# Patient Record
Sex: Female | Born: 1980 | Race: White | Hispanic: No | Marital: Single | Smoking: Current every day smoker
Health system: Southern US, Community
[De-identification: ages and names within clinical notes are randomized; demographics above are authoritative.]

## PROBLEM LIST (undated history)

## (undated) DIAGNOSIS — F1910 Other psychoactive substance abuse, uncomplicated: Secondary | ICD-10-CM

## (undated) DIAGNOSIS — F4310 Post-traumatic stress disorder, unspecified: Secondary | ICD-10-CM

## (undated) DIAGNOSIS — F909 Attention-deficit hyperactivity disorder, unspecified type: Secondary | ICD-10-CM

## (undated) DIAGNOSIS — F1120 Opioid dependence, uncomplicated: Secondary | ICD-10-CM

## (undated) DIAGNOSIS — F419 Anxiety disorder, unspecified: Secondary | ICD-10-CM

## (undated) DIAGNOSIS — F319 Bipolar disorder, unspecified: Secondary | ICD-10-CM

## (undated) DIAGNOSIS — F32A Depression, unspecified: Secondary | ICD-10-CM

## (undated) DIAGNOSIS — F329 Major depressive disorder, single episode, unspecified: Secondary | ICD-10-CM

## (undated) DIAGNOSIS — F819 Developmental disorder of scholastic skills, unspecified: Secondary | ICD-10-CM

## (undated) HISTORY — PX: TONSILLECTOMY: SUR1361

---

## 2008-05-19 ENCOUNTER — Emergency Department (HOSPITAL_BASED_OUTPATIENT_CLINIC_OR_DEPARTMENT_OTHER): Admission: EM | Admit: 2008-05-19 | Discharge: 2008-05-19 | Payer: Self-pay | Admitting: Emergency Medicine

## 2008-05-28 ENCOUNTER — Encounter: Payer: Self-pay | Admitting: Emergency Medicine

## 2008-05-28 ENCOUNTER — Inpatient Hospital Stay (HOSPITAL_COMMUNITY): Admission: AD | Admit: 2008-05-28 | Discharge: 2008-05-28 | Payer: Self-pay | Admitting: Obstetrics & Gynecology

## 2008-06-20 ENCOUNTER — Encounter: Payer: Self-pay | Admitting: Obstetrics and Gynecology

## 2008-06-20 ENCOUNTER — Ambulatory Visit: Payer: Self-pay | Admitting: Obstetrics & Gynecology

## 2008-06-20 LAB — CONVERTED CEMR LAB
Eosinophils Absolute: 0.1 10*3/uL (ref 0.0–0.7)
Hepatitis B Surface Ag: NEGATIVE
Lymphocytes Relative: 32 % (ref 12–46)
Lymphs Abs: 3.7 10*3/uL (ref 0.7–4.0)
Neutro Abs: 7 10*3/uL (ref 1.7–7.7)
Neutrophils Relative %: 61 % (ref 43–77)
Platelets: 238 10*3/uL (ref 150–400)
Rh Type: POSITIVE
Rubella: 97.8 intl units/mL — ABNORMAL HIGH
WBC: 11.5 10*3/uL — ABNORMAL HIGH (ref 4.0–10.5)

## 2008-07-11 ENCOUNTER — Ambulatory Visit: Payer: Self-pay | Admitting: Obstetrics & Gynecology

## 2008-07-13 ENCOUNTER — Inpatient Hospital Stay (HOSPITAL_COMMUNITY): Admission: RE | Admit: 2008-07-13 | Discharge: 2008-07-13 | Payer: Self-pay | Admitting: Family Medicine

## 2008-07-26 ENCOUNTER — Ambulatory Visit: Payer: Self-pay | Admitting: Family Medicine

## 2008-07-26 ENCOUNTER — Ambulatory Visit (HOSPITAL_COMMUNITY): Admission: RE | Admit: 2008-07-26 | Discharge: 2008-07-26 | Payer: Self-pay | Admitting: Obstetrics & Gynecology

## 2008-08-01 ENCOUNTER — Ambulatory Visit: Payer: Self-pay | Admitting: Obstetrics & Gynecology

## 2008-08-08 ENCOUNTER — Ambulatory Visit: Payer: Self-pay | Admitting: Obstetrics & Gynecology

## 2008-08-09 ENCOUNTER — Inpatient Hospital Stay (HOSPITAL_COMMUNITY): Admission: RE | Admit: 2008-08-09 | Discharge: 2008-08-09 | Payer: Self-pay | Admitting: Obstetrics & Gynecology

## 2008-08-09 ENCOUNTER — Ambulatory Visit: Payer: Self-pay | Admitting: Physician Assistant

## 2008-08-15 ENCOUNTER — Ambulatory Visit: Payer: Self-pay | Admitting: Obstetrics & Gynecology

## 2008-08-15 LAB — CONVERTED CEMR LAB
MCHC: 32.2 g/dL (ref 30.0–36.0)
MCV: 89.1 fL (ref 78.0–100.0)
RBC: 3.94 M/uL (ref 3.87–5.11)

## 2008-09-13 ENCOUNTER — Ambulatory Visit: Payer: Self-pay | Admitting: Family Medicine

## 2008-09-14 ENCOUNTER — Encounter: Payer: Self-pay | Admitting: Obstetrics & Gynecology

## 2008-11-01 ENCOUNTER — Ambulatory Visit: Payer: Self-pay | Admitting: Family Medicine

## 2008-11-01 ENCOUNTER — Encounter: Payer: Self-pay | Admitting: Obstetrics & Gynecology

## 2008-11-02 ENCOUNTER — Encounter: Payer: Self-pay | Admitting: Obstetrics & Gynecology

## 2008-11-08 ENCOUNTER — Ambulatory Visit: Payer: Self-pay | Admitting: Family Medicine

## 2008-11-08 ENCOUNTER — Inpatient Hospital Stay (HOSPITAL_COMMUNITY): Admission: RE | Admit: 2008-11-08 | Discharge: 2008-11-10 | Payer: Self-pay | Admitting: Family Medicine

## 2008-11-27 ENCOUNTER — Ambulatory Visit: Payer: Self-pay | Admitting: Obstetrics & Gynecology

## 2009-01-20 ENCOUNTER — Emergency Department (HOSPITAL_BASED_OUTPATIENT_CLINIC_OR_DEPARTMENT_OTHER): Admission: EM | Admit: 2009-01-20 | Discharge: 2009-01-20 | Payer: Self-pay | Admitting: Emergency Medicine

## 2009-02-21 ENCOUNTER — Emergency Department (HOSPITAL_BASED_OUTPATIENT_CLINIC_OR_DEPARTMENT_OTHER): Admission: EM | Admit: 2009-02-21 | Discharge: 2009-02-21 | Payer: Self-pay | Admitting: Emergency Medicine

## 2010-09-14 ENCOUNTER — Encounter: Payer: Self-pay | Admitting: *Deleted

## 2010-11-30 LAB — DIFFERENTIAL
Basophils Relative: 1 % (ref 0–1)
Eosinophils Absolute: 0.1 10*3/uL (ref 0.0–0.7)
Eosinophils Relative: 1 % (ref 0–5)
Lymphs Abs: 4 10*3/uL (ref 0.7–4.0)
Monocytes Absolute: 0.6 10*3/uL (ref 0.1–1.0)
Monocytes Relative: 7 % (ref 3–12)

## 2010-11-30 LAB — BASIC METABOLIC PANEL
BUN: 10 mg/dL (ref 6–23)
Chloride: 105 mEq/L (ref 96–112)
GFR calc Af Amer: >60 mL/min (ref >60)
Potassium: 3.9 mEq/L (ref 3.5–5.1)

## 2010-11-30 LAB — POCT TOXICOLOGY PANEL

## 2010-11-30 LAB — CBC
HCT: 40.1 % (ref 36.0–46.0)
MCV: 83.3 fL (ref 78.0–100.0)
RBC: 4.82 MIL/uL (ref 3.87–5.11)
WBC: 8.4 10*3/uL (ref 4.0–10.5)

## 2010-11-30 LAB — URINALYSIS, ROUTINE W REFLEX MICROSCOPIC
Glucose, UA: NEGATIVE mg/dL
Hgb urine dipstick: NEGATIVE
Ketones, ur: NEGATIVE mg/dL
Protein, ur: NEGATIVE mg/dL
pH: 6 (ref 5.0–8.0)

## 2010-11-30 LAB — ETHANOL: Alcohol, Ethyl (B): <5 mg/dL (ref 0–10)

## 2010-12-02 LAB — URINALYSIS, ROUTINE W REFLEX MICROSCOPIC
Bilirubin Urine: NEGATIVE
Glucose, UA: NEGATIVE mg/dL
Hgb urine dipstick: NEGATIVE
Protein, ur: NEGATIVE mg/dL
Specific Gravity, Urine: 1.009 (ref 1.005–1.030)
Urobilinogen, UA: 1 mg/dL (ref 0.0–1.0)

## 2010-12-02 LAB — DIFFERENTIAL
Basophils Relative: 0 % (ref 0–1)
Eosinophils Absolute: 0.1 10*3/uL (ref 0.0–0.7)
Eosinophils Relative: 1 % (ref 0–5)
Lymphs Abs: 2.9 10*3/uL (ref 0.7–4.0)
Monocytes Absolute: 0.5 10*3/uL (ref 0.1–1.0)
Monocytes Relative: 7 % (ref 3–12)

## 2010-12-02 LAB — BASIC METABOLIC PANEL
CO2: 26 mEq/L (ref 19–32)
Chloride: 109 mEq/L (ref 96–112)
Creatinine, Ser: 0.7 mg/dL (ref 0.4–1.2)
GFR calc Af Amer: >60 mL/min (ref >60)
Potassium: 4 mEq/L (ref 3.5–5.1)

## 2010-12-02 LAB — CBC
HCT: 42.9 % (ref 36.0–46.0)
MCHC: 32.2 g/dL (ref 30.0–36.0)
MCV: 82.2 fL (ref 78.0–100.0)
RBC: 5.22 MIL/uL — ABNORMAL HIGH (ref 3.87–5.11)
WBC: 8 10*3/uL (ref 4.0–10.5)

## 2010-12-02 LAB — POCT TOXICOLOGY PANEL: Opiates: POSITIVE

## 2010-12-02 LAB — ETHANOL: Alcohol, Ethyl (B): <5 mg/dL (ref 0–10)

## 2010-12-04 LAB — RPR: RPR Ser Ql: NONREACTIVE

## 2010-12-04 LAB — CBC
HCT: 30.6 % — ABNORMAL LOW (ref 36.0–46.0)
MCV: 86 fL (ref 78.0–100.0)
MCV: 86.3 fL (ref 78.0–100.0)
Platelets: 164 10*3/uL (ref 150–400)
Platelets: 189 10*3/uL (ref 150–400)
RBC: 4.1 MIL/uL (ref 3.87–5.11)
RDW: 14.6 % (ref 11.5–15.5)
WBC: 15.9 10*3/uL — ABNORMAL HIGH (ref 4.0–10.5)

## 2010-12-04 LAB — CROSSMATCH: ABO/RH(D): AB POS

## 2010-12-04 LAB — ABO/RH: ABO/RH(D): AB POS

## 2010-12-08 LAB — POCT URINALYSIS DIP (DEVICE)
Glucose, UA: 100 mg/dL — AB
Ketones, ur: NEGATIVE mg/dL
Protein, ur: 100 mg/dL — AB
Urobilinogen, UA: 0.2 mg/dL (ref 0.0–1.0)

## 2011-01-06 NOTE — Op Note (Signed)
NAME:  Lynn Marshall, Lynn Marshall          ACCOUNT NO.:  [id number]   MEDICAL RECORD NO.:  [id number]           PATIENT TYPE:   LOCATION:                                 FACILITY:   PHYSICIAN:  Tanya S. Shawnie Pons, M.D.   DATE OF BIRTH:  08/05/1981   DATE OF PROCEDURE:  11/08/2008  DATE OF DISCHARGE:                               OPERATIVE REPORT   TIME OF PROCEDURE:  10:10.   PREOPERATIVE DIAGNOSES:  1. Intrauterine pregnancy at 39-0/7 weeks' gestation.  2. History of previous cesarean section x2.  3. Undesired fertility.  4. History of substances abuse.   POSTOPERATIVE DIAGNOSES:  1. Intrauterine pregnancy at 39-0/7 weeks' gestation.  2. History of previous cesarean section x2.  3. Undesired fertility.  4. History of substances abuse.  5. Nuchal cord.   PROCEDURES:  1. A repeat low-transverse cesarean section.  2. Bilateral tubal sterilization with Filshie clips.   SURGEON:  Shelbie Proctor. Shawnie Pons, MD   ASSISTANT:  Odie Sera, DO   ANESTHESIA:  Spinal with local.   INDICATION FOR PROCEDURE:  Lynn Marshall is a 30 year old  gravida 6, now para 3-0-3-3 at 39-0/7 weeks for repeat low-transverse  cesarean section due to a history of previous cesarean section x2.  She  also desires postpartum sterilization.  She has previously been  counseled the risks and benefits of these procedures to include, but not  limited to bleeding, infection, and damage to internal organs, which may  require additional surgery and possibly a hysterectomy.  She has also  been counseled on the failure rate of tubal sterilization of 1 in 200  with an increased rate of ectopic pregnancy should of failure occur.  The patient voiced understanding of this risks and desires to proceed  with both procedures.   DESCRIPTION OF PROCEDURE:  The patient was taken to the operating room,  where spinal anesthesia was introduced.  She was then prepped and draped  in the usual sterile manner and a time-out was  conducted.  Appropriate  anesthesia was confirmed.  Pfannenstiel incision was made in the skin  and extended through the subcutaneous layers to the fascia.  The fascia  was then incised in the midline and the fascial incision was extended  laterally with Mayo scissors.  The fascia was bluntly dissected off the  underlying rectus muscles.  The rectus muscles were entered bluntly with  a hemostat.  The opening was then extended superiorly and inferiorly,  first with Mayo scissors and then with manual traction.  The peritoneum  was also entered bluntly and extended with manual traction.  Appropriate  entry to the uterus was obtained.  The Alexis retractor was placed.  A  transverse incision was made in the lower uterine segment and extended  through the myometrial layers, two bulging membranes were noted.  The  uterine incision was extended laterally using manual traction.  The  membranes were ruptured using an Allis clamp.  Clear amniotic fluid was  noted.  The fetal head was grasped and elevated out of pelvis and  delivered through the uterine incision with the assistance of fundal  pressure.  The mouth and nares were bulb suctioned.  A tight nuchal cord  was noted and reduced manually.  The shoulders followed by the rest of  corpus were delivered in the usual manner.  The mouth and nares were  again bulb suctioned and the cord was clamped and cut x2, and the baby  was handed to the awaiting NICU staff.  The placenta was then delivered  with the assistance of fundal massage.  The placenta was intact and had  three-vessel cord.  The uterus was cleared of clots and debris with a  dry lap sponge.  The uterine incision was closed using 0 Vicryl in a  running interlocking fashion.  A one-layer closure was done.  Good  hemostasis was noted in the uterine incision.  Attention was then  brought to the fallopian tubes, the left fallopian tube was identified  and traced distally to identification of  the fimbriated end.  A Filshie  clip was placed approximately 2 cm from the cornum of the uterus without  difficulty.  The tube was then returned to the abdomen and the right  fallopian tube was identified and traced distally to identification of  fimbria.  A Filshie clip was then placed approximately 2-cm from cornum  of the uterus without difficulty.  The tube was then returned to the  abdomen.  Good hemostasis was again noted of the uterine incision.  The  peritoneum was closed using a 0 Vicryl in a running non-interlocking  fashion.  The fascia was then closed using 0 Vicryl in a running non-  interlocking fashion.  Good hemostasis was noted.  The were no defects  noted on the fascia.  Some small oozing of the subcutaneous tissues were  treated with electrocautery.  The skin was then closed with staples in  the usual manner.  Marcaine 10 mL each of 2.25% were injected both  superiorly and inferiorly to the uterine incision and the 5 mL were  injected at each anterior and superior iliac spine.  A pressure dressing  was applied.  All sponge, instrument, and needle counts were correct x2.   FINDINGS:  1. Clear amniotic fluid.  2. Viable female infant with Apgars of 9 and 9 and weight 7 pounds 9      ounces.  3. Grossly normal fallopian tubes and ovaries bilaterally.   SPECIMEN:  Placenta.   DISPOSITION:  To Labor and Delivery.  There is approximately 700 mL of  blood loss.  There were no immediate complications.  The patient was  taken to the PACU in good condition.      Odie Sera, DO  Electronically Signed     ______________________________  Shelbie Proctor. Shawnie Pons, M.D.    MC/MEDQ  D:  11/08/2008  T:  11/08/2008  Job:  [id number]

## 2011-01-06 NOTE — Discharge Summary (Signed)
NAME:  Lynn Marshall, Lynn Marshall          ACCOUNT NO.:  [id number]   MEDICAL RECORD NO.:  [id number]          PATIENT TYPE:  INP   LOCATION:  9143                          FACILITY:  [facility]   PHYSICIAN:  Tanya S. Shawnie Pons, M.D.   DATE OF BIRTH:  [birth year]   DATE OF ADMISSION:  11/08/2008  DATE OF DISCHARGE:  11/10/2008                               DISCHARGE SUMMARY   PREOPERATIVE DIAGNOSES:  1. Intrauterine pregnancy at 39 weeks and 0/7th days.  2. History of previous cesarean section x2.  3. Undesired fertility.  4. History of substance abuse.   POSTOPERATIVE DIAGNOSES:  1. Intrauterine pregnancy at 78 and 0/7th weeks gestation.  2. History of previous cesarean section x2.  3. Undesired fertility.  4. History of substance abuse.  5. Nuchal cords.   PROCEDURE:  1. Low-transverse cesarean section.  2. Bilateral tubal sterilization with Filshie clips.   HOSPITAL ADMISSION:  The patient is a 30 year old G6, P2-0-3-2, at 71  and 0/7 weeks, who was admitted for a repeat low transverse cesarean  section due to history of two prior cesarean sections and desire for  postpartum sterilization.  She had been follow in our [facility]  and had routine prenatal care.   PAST MEDICAL HISTORY:  Positive for substance abuse incarceration while  she is being followed in our [facility].  On 18th, a repeat low  transverse cesarean section was performed by Dr. Shawnie Pons and Dr. Noel Gerold,  and produced a viable female with Apgars of 9 and 9, weighing 7 pounds  and 9 ounces.  Please see the dictated operative note for details of the  surgery.   HOSPITAL COURSE:  The patient's postpartum and postoperative courses  have been uneventful.  Her vital signs have been within normal limits  and her exam is also within normal limits.  The patient is being  discharged on postoperative day #2 in stable status.  At her admission,  hemoglobin is 11.7, and hematocrit is 35.4.  At discharge, hemoglobin is  10.3 and  hematocrit 30.6.   DISCHARGE DIAGNOSES:  1. Intrauterine pregnancy at [redacted] weeks gestation.  2. History of previous cesarean section x2.  3. Undesired fertility.  4. History of substance abuse.   STATUS:  At the time of discharge is stable.   DISCHARGE PRESCRIPTION:  1. Percocet 5/325 with instructions of 1-2 tablets p.o. q.4-6 h.      p.r.n. pain.  2. Motrin 600 mg one q.6 h. p.r.n. cramping.   DISCHARGE INSTRUCTIONS:  The patient is instructed on incisional care as  well as bottle feeding and engorgement precautions.  She is given  instructions for pelvic rest x6 weeks, and no driving when on narcotic  pain medicines.  Signs and symptoms of patient were reviewed with  patient and when to call our office in the postpartum period for  abnormals.  The patient was instructed to follow up in 6 weeks at the  [facility] at [facility] for her postpartum visit.      Maylon Cos, C.N.M.      Shelbie Proctor. Shawnie Pons, M.D.  Electronically Signed  SS/MEDQ  D:  11/10/2008  T:  11/10/2008  Job:  [id number]

## 2011-05-25 LAB — CBC
Platelets: 281
WBC: 11.3 — ABNORMAL HIGH

## 2011-05-25 LAB — BASIC METABOLIC PANEL
BUN: 5 — ABNORMAL LOW
Creatinine, Ser: 0.4
GFR calc Af Amer: >60
GFR calc non Af Amer: >60

## 2011-05-25 LAB — DIFFERENTIAL
Lymphocytes Relative: 34
Lymphs Abs: 3.8
Neutro Abs: 6.7
Neutrophils Relative %: 59

## 2011-05-25 LAB — POCT TOXICOLOGY PANEL

## 2011-05-25 LAB — ETHANOL: Alcohol, Ethyl (B): <5

## 2011-05-25 LAB — HCG, QUANTITATIVE, PREGNANCY: hCG, Beta Chain, Quant, S: 27925 — ABNORMAL HIGH

## 2011-05-25 LAB — PREGNANCY, URINE: Preg Test, Ur: POSITIVE

## 2011-05-26 LAB — POCT URINALYSIS DIP (DEVICE)
Hgb urine dipstick: NEGATIVE
Hgb urine dipstick: NEGATIVE
Nitrite: NEGATIVE
Nitrite: NEGATIVE
Protein, ur: NEGATIVE
Urobilinogen, UA: 0.2
pH: 6
pH: 6.5

## 2011-05-26 LAB — WET PREP, GENITAL
Clue Cells Wet Prep HPF POC: NONE SEEN
Trich, Wet Prep: NONE SEEN
Yeast Wet Prep HPF POC: NONE SEEN

## 2011-05-26 LAB — URINE MICROSCOPIC-ADD ON

## 2011-05-26 LAB — CBC
HCT: 39.1
Hemoglobin: 12.9
MCHC: 32.8
MCV: 85.3
RBC: 4.59

## 2011-05-26 LAB — DIFFERENTIAL
Basophils Relative: 1
Eosinophils Absolute: 0
Eosinophils Relative: 0
Monocytes Absolute: 0.6
Monocytes Relative: 5

## 2011-05-26 LAB — GC/CHLAMYDIA PROBE AMP, GENITAL: Chlamydia, DNA Probe: NEGATIVE

## 2011-05-26 LAB — URINE CULTURE: Colony Count: NO GROWTH

## 2011-05-26 LAB — URINALYSIS, ROUTINE W REFLEX MICROSCOPIC
Glucose, UA: NEGATIVE
Ketones, ur: NEGATIVE
Leukocytes, UA: NEGATIVE
pH: 7

## 2011-05-26 LAB — STREP B DNA PROBE

## 2011-05-29 LAB — POCT URINALYSIS DIP (DEVICE)
Glucose, UA: 250 mg/dL — AB
Glucose, UA: NEGATIVE mg/dL
Nitrite: NEGATIVE
Nitrite: NEGATIVE
Protein, ur: 30 mg/dL — AB
Protein, ur: 30 mg/dL — AB
Specific Gravity, Urine: 1.015 (ref 1.005–1.030)
Specific Gravity, Urine: 1.02 (ref 1.005–1.030)
Urobilinogen, UA: 0.2 mg/dL (ref 0.0–1.0)
Urobilinogen, UA: 0.2 mg/dL (ref 0.0–1.0)

## 2011-05-29 LAB — GLUCOSE, CAPILLARY: Glucose-Capillary: 79 mg/dL (ref 70–99)

## 2012-01-11 ENCOUNTER — Emergency Department (HOSPITAL_COMMUNITY)
Admission: EM | Admit: 2012-01-11 | Discharge: 2012-01-11 | Disposition: A | Discharge status: Home / Self Care | Payer: Self-pay | Attending: Emergency Medicine | Admitting: Emergency Medicine

## 2012-01-11 ENCOUNTER — Encounter (HOSPITAL_COMMUNITY): Payer: Self-pay | Admitting: *Deleted

## 2012-01-11 DIAGNOSIS — F1110 Opioid abuse, uncomplicated: Secondary | ICD-10-CM

## 2012-01-11 DIAGNOSIS — F17200 Nicotine dependence, unspecified, uncomplicated: Secondary | ICD-10-CM | POA: Insufficient documentation

## 2012-01-11 DIAGNOSIS — L0291 Cutaneous abscess, unspecified: Secondary | ICD-10-CM

## 2012-01-11 DIAGNOSIS — F1210 Cannabis abuse, uncomplicated: Secondary | ICD-10-CM | POA: Insufficient documentation

## 2012-01-11 DIAGNOSIS — IMO0002 Reserved for concepts with insufficient information to code with codable children: Secondary | ICD-10-CM | POA: Insufficient documentation

## 2012-01-11 LAB — COMPREHENSIVE METABOLIC PANEL
ALT: 11 U/L (ref 0–35)
AST: 17 U/L (ref 0–37)
Alkaline Phosphatase: 78 U/L (ref 39–117)
CO2: 29 mEq/L (ref 19–32)
Calcium: 9 mg/dL (ref 8.4–10.5)
Potassium: 4.2 mEq/L (ref 3.5–5.1)
Sodium: 137 mEq/L (ref 135–145)
Total Protein: 7.4 g/dL (ref 6.0–8.3)

## 2012-01-11 LAB — URINALYSIS, ROUTINE W REFLEX MICROSCOPIC
Bilirubin Urine: NEGATIVE
Glucose, UA: NEGATIVE mg/dL
Hgb urine dipstick: NEGATIVE
Specific Gravity, Urine: 1.003 — ABNORMAL LOW (ref 1.005–1.030)
pH: 8 (ref 5.0–8.0)

## 2012-01-11 LAB — CBC
MCV: 85.9 fL (ref 78.0–100.0)
Platelets: 344 10*3/uL (ref 150–400)
RDW: 14.7 % (ref 11.5–15.5)
WBC: 12.8 10*3/uL — ABNORMAL HIGH (ref 4.0–10.5)

## 2012-01-11 LAB — URINE MICROSCOPIC-ADD ON

## 2012-01-11 LAB — DIFFERENTIAL
Basophils Absolute: 0 10*3/uL (ref 0.0–0.1)
Eosinophils Absolute: 0.2 10*3/uL (ref 0.0–0.7)
Eosinophils Relative: 2 % (ref 0–5)
Lymphocytes Relative: 30 % (ref 12–46)
Neutrophils Relative %: 59 % (ref 43–77)

## 2012-01-11 LAB — RAPID URINE DRUG SCREEN, HOSP PERFORMED
Amphetamines: NOT DETECTED
Barbiturates: NOT DETECTED
Benzodiazepines: NOT DETECTED
Cocaine: NOT DETECTED
Tetrahydrocannabinol: NOT DETECTED

## 2012-01-11 MED ORDER — CLINDAMYCIN HCL 300 MG PO CAPS
300.0000 mg | ORAL_CAPSULE | Freq: Once | ORAL | Status: AC
  Administered 2012-01-11: 300 mg via ORAL
  Filled 2012-01-11: qty 1

## 2012-01-11 MED ORDER — CLINDAMYCIN HCL 150 MG PO CAPS: 450.0000 mg | ORAL_CAPSULE | Freq: Three times a day (TID) | ORAL | Status: AC

## 2012-01-11 NOTE — Discharge Instructions (Signed)
Abscess An abscess (boil or furuncle) is an infected area that contains a collection of pus.  SYMPTOMS Signs and symptoms of an abscess include pain, tenderness, redness, or hardness. You may feel a moveable soft area under your skin. An abscess can occur anywhere in the body.  TREATMENT  A surgical cut (incision) may be made over your abscess to drain the pus. Gauze may be packed into the space or a drain may be looped through the abscess cavity (pocket). This provides a drain that will allow the cavity to heal from the inside outwards. The abscess may be painful for a few days, but should feel much better if it was drained.  Your abscess, if seen early, may not have localized and may not have been drained. If not, another appointment may be required if it does not get better on its own or with medications. HOME CARE INSTRUCTIONS   Only take over-the-counter or prescription medicines for pain, discomfort, or fever as directed by your caregiver.   Take your antibiotics as directed if they were prescribed. Finish them even if you start to feel better.   Keep the skin and clothes clean around your abscess.   If the abscess was drained, you will need to use gauze dressing to collect any draining pus. Dressings will typically need to be changed 3 or more times a day.   The infection may spread by skin contact with others. Avoid skin contact as much as possible.   Practice good hygiene. This includes regular hand washing, cover any draining skin lesions, and do not share personal care items.   If you participate in sports, do not share athletic equipment, towels, whirlpools, or personal care items. Shower after every practice or tournament.   If a draining area cannot be adequately covered:   Do not participate in sports.   Children should not participate in day care until the wound has healed or drainage stops.   If your caregiver has given you a follow-up appointment, it is very important  to keep that appointment. Not keeping the appointment could result in a much worse infection, chronic or permanent injury, pain, and disability. If there is any problem keeping the appointment, you must call back to this facility for assistance.  SEEK MEDICAL CARE IF:   You develop increased pain, swelling, redness, drainage, or bleeding in the wound site.   You develop signs of generalized infection including muscle aches, chills, fever, or a general ill feeling.   You have an oral temperature above 102 F (38.9 C).  MAKE SURE YOU:   Understand these instructions.   Will watch your condition.   Will get help right away if you are not doing well or get worse.  Document Released: 05/20/2005 Document Revised: 07/30/2011 Document Reviewed: 03/13/2008 ExitCare Patient Information 2012 [employer] RESOURCE GUIDE  Dental Problems  Patients with Medicaid: [facility]                     [address]                                           Phone:  [phone]                                                    If unable to pay or uninsured, contact:  [facility] or [facility] to become qualified for the adult dental clinic.  Chronic Pain Problems Contact [facility]  [phone] Patients need to be referred by their primary care doctor.  Insufficient Money for Medicine Contact [employer]:  call "211" or [facility] 271-5999.  No Primary Care Doctor Call [facility]  832-8000 Other agencies that provide inexpensive medical care    [facility]  832-8035    [facility]  [phone]    [facility]  [phone]    [facility]  [phone]    [facility]  [phone]    [facility]  [phone]  Substance Abuse Resources [facility]  336-882-2125 [facility] 336-784-9470 [facility] 336-285-9073 [facility]  336-845-3988 Residential & Outpatient Substance Abuse Program  800-659-3381  Psychological Services [facility]  832-9600 [facility]  [phone] [facility]   800 853-5163 (emergency services 641-4993)  Abuse/Neglect [place] Child Abuse Hotline (336) 641-3795 [place] Child Abuse Hotline 800-378-5315 (After Hours)  Emergency Shelter [facility] (336) 271-5985  [facility] (336) 275-9566 [facility] (704) 372-4663  MRSA Hotline #:   832-7006    [place] Resources  [facility] [place]  [employer]                           [facility] [address] [place]                     [address]         [address]  [place]                                               [place]                              [place] Phone:  [phone]                                  Phone:  [phone]                   Phone:  [phone]  [facility] Phone:  [phone]  [place] Child Abuse Hotline (336) 342-1394 (336) 342-3537 (After Hours) 

## 2012-01-11 NOTE — ED Notes (Signed)
Pharmacy notified for 2000 medication

## 2012-01-11 NOTE — ED Notes (Signed)
MD at bedside. 

## 2012-01-11 NOTE — ED Notes (Signed)
To ED for eval of abscess to right forearm. Pt also requesting detox from heroin. Last use this am

## 2012-01-11 NOTE — ED Provider Notes (Signed)
History     CSN: [id number]  Arrival date & time 01/11/12  1509   First MD Initiated Contact with Patient 01/11/12 1853      Chief Complaint  Patient presents with  . Abscess    here for arm abscess; also requesting detox  . detox     (Consider location/radiation/quality/duration/timing/severity/associated sxs/prior treatment) HPI Pt with history of IV heroin abuse has had 3 days of pain and swelling to her R forearm. No drainage, no fever. Pain is severe, aching and radiating up to her axilla.   Pt also wants to be evaluated for heroin detox. She last used this morning. Has been through detox in the past. She has smoked marijuana recently, but denies any other coingestions. She denies any SI/HI.  History reviewed. No pertinent past medical history.  Past Surgical History  Procedure Date  . Cesarean section     History reviewed. No pertinent family history.  History  Substance Use Topics  . Smoking status: Current Everyday Smoker -- 1.0 packs/day    Types: Cigarettes  . Smokeless tobacco: Not on file  . Alcohol Use: No    OB History    Grav Para Term Preterm Abortions TAB SAB Ect Mult Living                  Review of Systems All other systems reviewed and are negative except as noted in HPI.   Allergies  Review of patient's allergies indicates no known allergies.  Home Medications   Current Outpatient Rx  Name Route Sig Dispense Refill  . IBUPROFEN 200 MG PO TABS Oral Take 600 mg by mouth every 6 (six) hours as needed. For pain      BP 111/78  Pulse 78  Temp(Src) 98.7 F (37.1 C) (Oral)  Resp 14  SpO2 99%  Physical Exam  Nursing note and vitals reviewed. Constitutional: She is oriented to person, place, and time. She appears well-developed and well-nourished.  HENT:  Head: Normocephalic and atraumatic.  Eyes: EOM are normal. Pupils are equal, round, and reactive to light.  Neck: Normal range of motion. Neck supple.  Cardiovascular: Normal rate,  normal heart sounds and intact distal pulses.   Pulmonary/Chest: Effort normal and breath sounds normal.  Abdominal: Bowel sounds are normal. She exhibits no distension. There is no tenderness.  Musculoskeletal: Normal range of motion. She exhibits edema and tenderness.       3cm x 5cm area of induration and erythema to R forearm with small area of central fluctuance.   Neurological: She is alert and oriented to person, place, and time. She has normal strength. No cranial nerve deficit or sensory deficit.  Skin: Skin is warm and dry. No rash noted.  Psychiatric: She has a normal mood and affect.    ED Course  Procedures (including critical care time)  Labs Reviewed  CBC - Abnormal; Notable for the following:    WBC 12.8 (*)    All other components within normal limits  DIFFERENTIAL - Abnormal; Notable for the following:    Monocytes Absolute 1.1 (*)    All other components within normal limits  COMPREHENSIVE METABOLIC PANEL  URINALYSIS, ROUTINE W REFLEX MICROSCOPIC  URINE RAPID DRUG SCREEN (HOSP PERFORMED)  ETHANOL   No results found.   No diagnosis found.    MDM  INCISION AND DRAINAGE Performed by: Pollyann Savoy. Consent: Verbal consent obtained. Risks and benefits: risks, benefits and alternatives were discussed Time out performed prior to procedure Type: abscess  Body area: R forearm  Anesthesia: local infiltration  Local anesthetic: lidocaine 1%  Anesthetic total: 1 ml  Complexity: complex Blunt dissection to break up loculations  Drainage: purulent  Drainage amount: 5mL  Packing material: 1/4" iodoform gauze  Patient tolerance: Patient tolerated the procedure well with no immediate complications.  9:15 PM Pt now states she does not want to stay for detox placement. Given a list of outpatient resources and advised to call for bed availability in the morning. Rx for clindamycin given as well.        Chrissie Dacquisto B. Bernette Mayers, MD 01/11/12 2116

## 2012-01-11 NOTE — ED Notes (Signed)
Substance Abuse Resources given to patient.

## 2012-04-17 ENCOUNTER — Emergency Department (HOSPITAL_COMMUNITY)
Admission: EM | Admit: 2012-04-17 | Discharge: 2012-04-18 | Disposition: A | Discharge status: Home / Self Care | Payer: Self-pay | Attending: Emergency Medicine | Admitting: Emergency Medicine

## 2012-04-17 ENCOUNTER — Encounter (HOSPITAL_COMMUNITY): Payer: Self-pay | Admitting: Emergency Medicine

## 2012-04-17 DIAGNOSIS — F17200 Nicotine dependence, unspecified, uncomplicated: Secondary | ICD-10-CM | POA: Insufficient documentation

## 2012-04-17 DIAGNOSIS — F1910 Other psychoactive substance abuse, uncomplicated: Secondary | ICD-10-CM | POA: Insufficient documentation

## 2012-04-17 LAB — CBC
HCT: 39 % (ref 36.0–46.0)
Hemoglobin: 12.8 g/dL (ref 12.0–15.0)
MCH: 26.9 pg (ref 26.0–34.0)
MCV: 82.1 fL (ref 78.0–100.0)
RBC: 4.75 MIL/uL (ref 3.87–5.11)

## 2012-04-17 LAB — COMPREHENSIVE METABOLIC PANEL
ALT: 11 U/L (ref 0–35)
CO2: 29 mEq/L (ref 19–32)
Calcium: 9.5 mg/dL (ref 8.4–10.5)
GFR calc Af Amer: >90 mL/min (ref >90)
GFR calc non Af Amer: >90 mL/min (ref >90)
Glucose, Bld: 109 mg/dL — ABNORMAL HIGH (ref 70–99)
Sodium: 139 mEq/L (ref 135–145)

## 2012-04-17 LAB — RAPID URINE DRUG SCREEN, HOSP PERFORMED
Barbiturates: NOT DETECTED
Cocaine: POSITIVE — AB

## 2012-04-17 LAB — POCT PREGNANCY, URINE: Preg Test, Ur: NEGATIVE

## 2012-04-17 MED ORDER — LORAZEPAM 1 MG PO TABS: 1.0000 mg | ORAL_TABLET | Freq: Three times a day (TID) | ORAL | Status: DC | PRN

## 2012-04-17 MED ORDER — ONDANSETRON HCL 8 MG PO TABS: 4.0000 mg | ORAL_TABLET | Freq: Three times a day (TID) | ORAL | Status: DC | PRN

## 2012-04-17 MED ORDER — NICOTINE 21 MG/24HR TD PT24: 21.0000 mg | MEDICATED_PATCH | Freq: Every day | TRANSDERMAL | Status: DC

## 2012-04-17 MED ORDER — CLONIDINE HCL 0.1 MG PO TABS
0.1000 mg | ORAL_TABLET | Freq: Once | ORAL | Status: AC
  Administered 2012-04-17: 0.1 mg via ORAL
  Filled 2012-04-17: qty 1

## 2012-04-17 MED ORDER — ACETAMINOPHEN 325 MG PO TABS: 650.0000 mg | ORAL_TABLET | ORAL | Status: DC | PRN

## 2012-04-17 MED ORDER — IBUPROFEN 400 MG PO TABS: 600.0000 mg | ORAL_TABLET | Freq: Three times a day (TID) | ORAL | Status: DC | PRN

## 2012-04-17 MED ORDER — ZOLPIDEM TARTRATE 5 MG PO TABS: 5.0000 mg | ORAL_TABLET | Freq: Every evening | ORAL | Status: DC | PRN

## 2012-04-17 NOTE — ED Notes (Signed)
Pt requests detox from heroin.  Last heroin 4 hours ago. Denies suicidal and homicidal ideation.

## 2012-04-17 NOTE — ED Notes (Signed)
Irregular periods since March

## 2012-04-17 NOTE — ED Provider Notes (Signed)
History     CSN: [id number]  Arrival date & time 04/17/12  2115   First MD Initiated Contact with Patient 04/17/12 2220      Chief Complaint  Patient presents with  . Medical Clearance    (Consider location/radiation/quality/duration/timing/severity/associated sxs/prior treatment) HPI Comments: Patient is addicted to heroin she snorts this on a daily basis she's using 10-12 packs per day she's been a heroin addict for number of years she was last detoxed 6-8 months ago at [facility] at this point she has not had any withdrawal symptoms  The history is provided by the patient.    History reviewed. No pertinent past medical history.  Past Surgical History  Procedure Date  . Cesarean section     No family history on file.  History  Substance Use Topics  . Smoking status: Current Everyday Smoker -- 1.0 packs/day    Types: Cigarettes  . Smokeless tobacco: Not on file  . Alcohol Use: Yes    OB History    Grav Para Term Preterm Abortions TAB SAB Ect Mult Living                  Review of Systems  Gastrointestinal: Negative for nausea, vomiting and abdominal pain.  Musculoskeletal: Negative for myalgias.  Neurological: Negative for dizziness and weakness.    Allergies  Review of patient's allergies indicates no known allergies.  Home Medications  No current outpatient prescriptions on file.  BP 90/57  Pulse 65  Temp 98.3 F (36.8 C) (Oral)  Resp 16  SpO2 100%  Physical Exam  Constitutional: She appears well-developed and well-nourished.  HENT:  Head: Normocephalic.  Eyes: Pupils are equal, round, and reactive to light.  Neck: Normal range of motion.  Cardiovascular: Normal rate.   Musculoskeletal: Normal range of motion.  Neurological: She is alert.  Skin: Skin is warm.    ED Course  Procedures (including critical care time)  Labs Reviewed  CBC - Abnormal; Notable for the following:    WBC 13.3 (*)     RDW 17.7 (*)     All other  components within normal limits  COMPREHENSIVE METABOLIC PANEL - Abnormal; Notable for the following:    Potassium 3.3 (*)     Glucose, Bld 109 (*)     All other components within normal limits  URINE RAPID DRUG SCREEN (HOSP PERFORMED) - Abnormal; Notable for the following:    Opiates POSITIVE (*)     Cocaine POSITIVE (*)     Benzodiazepines POSITIVE (*)     Tetrahydrocannabinol POSITIVE (*)     All other components within normal limits  ETHANOL  POCT PREGNANCY, URINE   No results found.   1. Substance abuse       MDM   Patient requesting detox from heroine will consul tACT Patient has been accepted awaiting transportation        Arman Filter, NP 04/17/12 2234  Arman Filter, NP 04/18/12 4098

## 2012-04-18 LAB — URINALYSIS, MICROSCOPIC ONLY
Glucose, UA: NEGATIVE mg/dL
Ketones, ur: NEGATIVE mg/dL
Nitrite: NEGATIVE
Protein, ur: 30 mg/dL — AB
pH: 6 (ref 5.0–8.0)

## 2012-04-18 MED ORDER — POTASSIUM CHLORIDE CRYS ER 20 MEQ PO TBCR
20.0000 meq | EXTENDED_RELEASE_TABLET | Freq: Once | ORAL | Status: AC
  Administered 2012-04-18: 20 meq via ORAL
  Filled 2012-04-18: qty 1
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
## 2012-04-18 NOTE — ED Provider Notes (Signed)
Medical screening examination/treatment/procedure(s) were performed by non-physician practitioner and as supervising physician I was immediately available for consultation/collaboration.   Glynn Octave, MD 04/18/12 [phone]

## 2012-05-16 ENCOUNTER — Emergency Department (HOSPITAL_COMMUNITY)
Admission: EM | Admit: 2012-05-16 | Discharge: 2012-05-16 | Disposition: A | Discharge status: Home / Self Care | Payer: Self-pay | Attending: Emergency Medicine | Admitting: Emergency Medicine

## 2012-05-16 ENCOUNTER — Encounter (HOSPITAL_COMMUNITY): Payer: Self-pay

## 2012-05-16 DIAGNOSIS — F329 Major depressive disorder, single episode, unspecified: Secondary | ICD-10-CM | POA: Insufficient documentation

## 2012-05-16 DIAGNOSIS — F3289 Other specified depressive episodes: Secondary | ICD-10-CM | POA: Insufficient documentation

## 2012-05-16 DIAGNOSIS — N390 Urinary tract infection, site not specified: Secondary | ICD-10-CM | POA: Insufficient documentation

## 2012-05-16 DIAGNOSIS — N764 Abscess of vulva: Secondary | ICD-10-CM | POA: Insufficient documentation

## 2012-05-16 DIAGNOSIS — F17200 Nicotine dependence, unspecified, uncomplicated: Secondary | ICD-10-CM | POA: Insufficient documentation

## 2012-05-16 DIAGNOSIS — L0291 Cutaneous abscess, unspecified: Secondary | ICD-10-CM

## 2012-05-16 HISTORY — DX: Depression, unspecified: F32.A

## 2012-05-16 HISTORY — DX: Major depressive disorder, single episode, unspecified: F32.9

## 2012-05-16 LAB — URINE MICROSCOPIC-ADD ON

## 2012-05-16 LAB — URINALYSIS, ROUTINE W REFLEX MICROSCOPIC
Nitrite: POSITIVE — AB
Specific Gravity, Urine: 1.009 (ref 1.005–1.030)
Urobilinogen, UA: 0.2 mg/dL (ref 0.0–1.0)
pH: 6.5 (ref 5.0–8.0)

## 2012-05-16 MED ORDER — HYDROMORPHONE HCL PF 1 MG/ML IJ SOLN
1.0000 mg | Freq: Once | INTRAMUSCULAR | Status: AC
  Administered 2012-05-16: 1 mg via INTRAMUSCULAR
  Filled 2012-05-16: qty 1

## 2012-05-16 MED ORDER — SULFAMETHOXAZOLE-TRIMETHOPRIM 800-160 MG PO TABS: 1.0000 | ORAL_TABLET | Freq: Two times a day (BID) | ORAL | Status: DC

## 2012-05-16 MED ORDER — OXYCODONE-ACETAMINOPHEN 5-325 MG PO TABS: 1.0000 | ORAL_TABLET | Freq: Four times a day (QID) | ORAL | Status: DC | PRN

## 2012-05-16 MED ORDER — CEPHALEXIN 500 MG PO CAPS: 500.0000 mg | ORAL_CAPSULE | Freq: Four times a day (QID) | ORAL | Status: DC

## 2012-05-16 MED ORDER — OXYCODONE-ACETAMINOPHEN 5-325 MG PO TABS
2.0000 | ORAL_TABLET | Freq: Once | ORAL | Status: AC
  Administered 2012-05-16: 2 via ORAL
  Filled 2012-05-16: qty 2

## 2012-05-16 NOTE — ED Provider Notes (Signed)
History     CSN: [id number]  Arrival date & time 05/16/12  1202   First MD Initiated Contact with Patient 05/16/12 1307      Chief Complaint  Patient presents with  . Abscess    (Consider location/radiation/quality/duration/timing/severity/associated sxs/prior treatment) HPI Comments: Lynn Marshall 31 y.o. female   The chief complaint is: Patient presents with:   Abscess   The patient has medical history significant for:   Past Medical History:   Depression                                                  Patient has a history of multiple abcesses, 7 in her lifetime. Today she presents with an abscess of her inner thigh and one on her labia majora. She states that the pain from these area are 8/10, with some flucturance. She saw a physician 2 months ago for the inner thigh abscess, was given ABX without I &D. She states there was some resolution but area continued to drain. The area on the labia has been there for two days. Denies fever or chills. Denies NVD or abdominal pain. Denies risk for STI's. Denies dysuria, urgency, or frequency.      The history is provided by the patient. No language interpreter was used.    Past Medical History  Diagnosis Date  . Depression     Past Surgical History  Procedure Date  . Cesarean section   . Tonsillectomy     No family history on file.  History  Substance Use Topics  . Smoking status: Current Every Day Smoker -- 1.0 packs/day    Types: Cigarettes  . Smokeless tobacco: Not on file  . Alcohol Use: Yes    OB History    Grav Para Term Preterm Abortions TAB SAB Ect Mult Living                  Review of Systems  Constitutional: Negative for fever and chills.  Gastrointestinal: Negative for nausea, vomiting, abdominal pain and diarrhea.  Genitourinary: Positive for vaginal pain. Negative for dysuria, urgency and frequency.  Skin: Positive for color change.  All other systems reviewed and are  negative.    Allergies  Review of patient's allergies indicates no known allergies.  Home Medications   Current Outpatient Rx  Name Route Sig Dispense Refill  . FLUOXETINE HCL 20 MG PO CAPS Oral Take 20 mg by mouth daily.      BP 114/77  Pulse 69  Temp 97.6 F (36.4 C) (Oral)  Resp 16  SpO2 98%  Physical Exam  Nursing note and vitals reviewed. Constitutional: She appears well-developed and well-nourished. She appears distressed.  HENT:  Head: Normocephalic and atraumatic.  Eyes: Conjunctivae normal and EOM are normal. No scleral icterus.  Neck: Normal range of motion. Neck supple.  Cardiovascular: Normal rate and normal heart sounds.   Pulmonary/Chest: Effort normal and breath sounds normal.  Abdominal: Soft. Bowel sounds are normal. There is no tenderness.  Genitourinary: Vagina normal.    There is tenderness and lesion on the right labia.  Neurological: She is alert.  Skin: Skin is warm and dry.    ED Course  Procedures (including critical care time) INCISION AND DRAINAGE Performed by: Pixie Casino Consent: Verbal consent obtained. Risks and benefits: risks, benefits and alternatives were discussed Type: abscess  Body area: right groin and right labia majora  Anesthesia: local infiltration  Local anesthetic: lidocaine 1%   Anesthetic total:3 ml  Complexity: complex Blunt dissection to break up loculations  Drainage: purulent  Drainage amount: 4ml  Packing material: 1/4 in iodoform gauze  Patient tolerance: Patient tolerated the procedure well with no immediate complications.    Labs Reviewed  URINALYSIS, ROUTINE W REFLEX MICROSCOPIC - Abnormal; Notable for the following:    APPearance CLOUDY (*)     Hgb urine dipstick TRACE (*)     Nitrite POSITIVE (*)     Leukocytes, UA MODERATE (*)     All other components within normal limits  URINE MICROSCOPIC-ADD ON - Abnormal; Notable for the following:    Squamous Epithelial / LPF FEW (*)      Bacteria, UA MANY (*)     All other components within normal limits   No results found.   1. Abscess   2. UTI (lower urinary tract infection)       MDM  Patient presented with abscess X 2 on her labia major and right groin. I &D performed successfully.UA: remarkable for UTI. Patient discharged on bactrim for UTI and keflex with return precautions. No red flags for bartholin's gland abscess or STI.         Pixie Casino, PA-C 05/16/12 1941

## 2012-05-16 NOTE — ED Notes (Signed)
C/o 2 abscess, one on her right inner thigh x 2 month, and one on her genital area (labia majora) x 2 days. Draining and tender touch, recently shaved her genital hair.

## 2012-05-18 LAB — URINE CULTURE

## 2012-05-18 NOTE — ED Provider Notes (Signed)
Medical screening examination/treatment/procedure(s) were performed by non-physician practitioner and as supervising physician I was immediately available for consultation/collaboration.   Suzi Roots, MD 05/18/12 [phone]

## 2012-05-20 NOTE — ED Notes (Signed)
+   Urine Patient treated Bactrim and macrobid-sts

## 2012-09-07 ENCOUNTER — Emergency Department (HOSPITAL_COMMUNITY)
Admission: EM | Admit: 2012-09-07 | Discharge: 2012-09-07 | Disposition: A | Discharge status: Home / Self Care | Payer: Self-pay | Attending: Emergency Medicine | Admitting: Emergency Medicine

## 2012-09-07 ENCOUNTER — Encounter (HOSPITAL_COMMUNITY): Payer: Self-pay | Admitting: Emergency Medicine

## 2012-09-07 DIAGNOSIS — N764 Abscess of vulva: Secondary | ICD-10-CM | POA: Insufficient documentation

## 2012-09-07 DIAGNOSIS — F3289 Other specified depressive episodes: Secondary | ICD-10-CM | POA: Insufficient documentation

## 2012-09-07 DIAGNOSIS — Z79899 Other long term (current) drug therapy: Secondary | ICD-10-CM | POA: Insufficient documentation

## 2012-09-07 DIAGNOSIS — L0291 Cutaneous abscess, unspecified: Secondary | ICD-10-CM

## 2012-09-07 DIAGNOSIS — F329 Major depressive disorder, single episode, unspecified: Secondary | ICD-10-CM | POA: Insufficient documentation

## 2012-09-07 DIAGNOSIS — F17200 Nicotine dependence, unspecified, uncomplicated: Secondary | ICD-10-CM | POA: Insufficient documentation

## 2012-09-07 MED ORDER — SULFAMETHOXAZOLE-TRIMETHOPRIM 800-160 MG PO TABS: 1.0000 | ORAL_TABLET | Freq: Two times a day (BID) | ORAL | Status: DC

## 2012-09-07 MED ORDER — LIDOCAINE HCL 2 % IJ SOLN
INTRAMUSCULAR | Status: AC
  Administered 2012-09-07: 400 mg
  Filled 2012-09-07: qty 20

## 2012-09-07 MED ORDER — HYDROCODONE-ACETAMINOPHEN 5-325 MG PO TABS
2.0000 | ORAL_TABLET | Freq: Once | ORAL | Status: AC
  Administered 2012-09-07: 2 via ORAL
  Filled 2012-09-07: qty 2

## 2012-09-07 NOTE — ED Notes (Signed)
Pt escorted to discharge window. Verbalized understanding discharge instructions. In no acute distress.   

## 2012-09-07 NOTE — ED Notes (Signed)
PA at bedside.

## 2012-09-07 NOTE — ED Notes (Signed)
Pt presenting to ed with c/o abscess to labia x 2 days with drainage.

## 2012-09-07 NOTE — ED Provider Notes (Signed)
History     CSN: [id number]  Arrival date & time 09/07/12  1134   First MD Initiated Contact with Patient 09/07/12 1150      Chief Complaint  Patient presents with  . Abscess    (Consider location/radiation/quality/duration/timing/severity/associated sxs/prior treatment) HPI Comments: This is a 32 year old female, who presents emergency department with chief complaint of abscess. The abscess is located on her right labia.  The patient states that she has had the abscess for the past 2 days.  She denies having fever, nausea, or vomiting.  The patient has a history of abscesses.  The patient shaves frequently, which is likely the cause of her symptoms.  She has tried epson salt baths, and warm compresses with no relief.  There are no aggravating or alleviating factors.  The patient states that her pain is moderate-severe.  The history is provided by the patient. No language interpreter was used.    Past Medical History  Diagnosis Date  . Depression     Past Surgical History  Procedure Date  . Cesarean section   . Tonsillectomy     No family history on file.  History  Substance Use Topics  . Smoking status: Current Every Day Smoker -- 1.0 packs/day    Types: Cigarettes  . Smokeless tobacco: Not on file  . Alcohol Use: Yes     Comment: occassionally    OB History    Grav Para Term Preterm Abortions TAB SAB Ect Mult Living                  Review of Systems  All other systems reviewed and are negative.    Allergies  Review of patient's allergies indicates no known allergies.  Home Medications   Current Outpatient Rx  Name  Route  Sig  Dispense  Refill  . FLUOXETINE HCL 20 MG PO CAPS   Oral   Take 20 mg by mouth every evening.          . SULFAMETHOXAZOLE-TRIMETHOPRIM 800-160 MG PO TABS   Oral   Take 1 tablet by mouth every 12 (twelve) hours.   20 tablet   0     BP 129/85  Pulse 105  Temp 98.1 F (36.7 C) (Oral)  Resp 18  SpO2 99%  LMP  08/22/2012  Physical Exam  Nursing note and vitals reviewed. Constitutional: She is oriented to person, place, and time. She appears well-developed and well-nourished.  HENT:  Head: Normocephalic and atraumatic.  Eyes: Conjunctivae normal and EOM are normal.  Neck: Normal range of motion.  Cardiovascular: Normal rate.   Pulmonary/Chest: Effort normal.  Abdominal: She exhibits no distension.  Musculoskeletal: Normal range of motion.  Neurological: She is alert and oriented to person, place, and time.  Skin: Skin is dry.       2x2 cm abscess to right labia, with mild surrounding erythema, no drainage.  Psychiatric: She has a normal mood and affect. Her behavior is normal. Judgment and thought content normal.    ED Course  Procedures (including critical care time)  Labs Reviewed - No data to display No results found.  INCISION AND DRAINAGE Performed by: Roxy Horseman Consent: Verbal consent obtained. Risks and benefits: risks, benefits and alternatives were discussed Type: abscess  Body area: right labia  Anesthesia: local infiltration  Incision was made with a scalpel.  Local anesthetic: lidocaine 2% without epinephrine  Anesthetic total: 4 ml  Complexity: complex Blunt dissection to break up loculations  Drainage: purulent  Drainage  amount: 2 ml  Packing material: no packing used  Patient tolerance: Patient tolerated the procedure well with no immediate complications.     1. Abscess       MDM  32 year old female with labial abscess. Incised and drained in the ED. Patient given Vicodin for pain in the ED. Will discharge the patient with Bactrim, and encourage the patient to use warm compresses. Encouraged patient to stop shaving. Patient understands and agrees with the plan. Return precautions have been given. Patient is stable and ready for discharge.        Roxy Horseman, PA-C 09/07/12 1504

## 2012-09-07 NOTE — ED Notes (Signed)
Pt c/o recurring abscess on  R labia.  Sts "it got better, after it was previously drained, but it returned two days ago."  Sts swelling is "higher" on peritoneal area than it was last time.  Sts she has felt fatigued and "not like myself" for two days.

## 2012-09-07 NOTE — ED Provider Notes (Signed)
Medical screening examination/treatment/procedure(s) were performed by non-physician practitioner and as supervising physician I was immediately available for consultation/collaboration.  Pallavi Clifton, MD 09/07/12 1541 

## 2012-09-08 NOTE — Progress Notes (Signed)
During 09/07/12 [facility] ED visit noted pt listed with no insurance coverage CM and [facility] liaison spoke with pt.  Pt offered services to assist with finding a [place] self pay provider & health reform information 

## 2013-09-10 ENCOUNTER — Encounter (HOSPITAL_COMMUNITY): Payer: Self-pay | Admitting: Emergency Medicine

## 2013-09-10 ENCOUNTER — Emergency Department (HOSPITAL_COMMUNITY)
Admission: EM | Admit: 2013-09-10 | Discharge: 2013-09-10 | Disposition: A | Discharge status: Home / Self Care | Payer: Self-pay | Attending: Emergency Medicine | Admitting: Emergency Medicine

## 2013-09-10 DIAGNOSIS — F329 Major depressive disorder, single episode, unspecified: Secondary | ICD-10-CM | POA: Insufficient documentation

## 2013-09-10 DIAGNOSIS — Z79899 Other long term (current) drug therapy: Secondary | ICD-10-CM | POA: Insufficient documentation

## 2013-09-10 DIAGNOSIS — R209 Unspecified disturbances of skin sensation: Secondary | ICD-10-CM | POA: Insufficient documentation

## 2013-09-10 DIAGNOSIS — L02412 Cutaneous abscess of left axilla: Secondary | ICD-10-CM

## 2013-09-10 DIAGNOSIS — F17200 Nicotine dependence, unspecified, uncomplicated: Secondary | ICD-10-CM | POA: Insufficient documentation

## 2013-09-10 DIAGNOSIS — F3289 Other specified depressive episodes: Secondary | ICD-10-CM | POA: Insufficient documentation

## 2013-09-10 DIAGNOSIS — IMO0002 Reserved for concepts with insufficient information to code with codable children: Secondary | ICD-10-CM | POA: Insufficient documentation

## 2013-09-10 MED ORDER — IBUPROFEN 400 MG PO TABS
800.0000 mg | ORAL_TABLET | Freq: Once | ORAL | Status: AC
  Administered 2013-09-10: 800 mg via ORAL
  Filled 2013-09-10: qty 2

## 2013-09-10 MED ORDER — HYDROCODONE-ACETAMINOPHEN 5-325 MG PO TABS: ORAL_TABLET | ORAL | Status: DC

## 2013-09-10 MED ORDER — NAPROXEN 500 MG PO TABS: 500.0000 mg | ORAL_TABLET | Freq: Two times a day (BID) | ORAL | Status: DC

## 2013-09-10 MED ORDER — SULFAMETHOXAZOLE-TRIMETHOPRIM 800-160 MG PO TABS: 1.0000 | ORAL_TABLET | Freq: Two times a day (BID) | ORAL | Status: DC

## 2013-09-10 NOTE — ED Provider Notes (Signed)
CSN: [id number]     Arrival date & time 09/10/13  1512 History  This chart was scribed for non-physician practitioner Junius Finner, PA-C working with Laray Anger, DO by Donne Anon, ED Scribe. This patient was seen in room TR07C/TR07C and the patient's care was started at 1559.    First MD Initiated Contact with Patient 09/10/13 1559     Chief Complaint  Patient presents with  . Abscess    The history is provided by the patient. No language interpreter was used.   HPI Comments: Lynn Marshall is a 33 y.o. female who presents to the Emergency Department complaining of 2 months of gradual onset, intermittent, recurring abscesses under her left armpit. She states the severity of the swelling wax and wane, but never completely leave. She has tried to drain the abscesses with little success. She has tried washing the area with antiseptic wash with mild relief.  She denies fever, nausea, vomiting or any other pain. She has not seen a medical provider for this problem. She states she has a history of abscesses along her bikini line and has had them professionally drained before. She denies allergies to any medication.  She does not currently have a PCP.  Past Medical History  Diagnosis Date  . Depression    Past Surgical History  Procedure Laterality Date  . Cesarean section    . Tonsillectomy     No family history on file. History  Substance Use Topics  . Smoking status: Current Every Day Smoker -- 1.00 packs/day    Types: Cigarettes  . Smokeless tobacco: Not on file  . Alcohol Use: Yes     Comment: occassionally   OB History   Grav Para Term Preterm Abortions TAB SAB Ect Mult Living                 Review of Systems  Constitutional: Negative for fever.  Gastrointestinal: Negative for nausea and vomiting.  Skin: Positive for color change and wound.  All other systems reviewed and are negative.    Allergies  Review of patient's allergies indicates no known  allergies.  Home Medications   Current Outpatient Rx  Name  Route  Sig  Dispense  Refill  . DULoxetine (CYMBALTA) 30 MG capsule   Oral   Take 30 mg by mouth daily.         Marland Kitchen HYDROcodone-acetaminophen (NORCO/VICODIN) 5-325 MG per tablet      Take 1-2 pills every 4-6 hours as needed for pain.   6 tablet   0   . naproxen (NAPROSYN) 500 MG tablet   Oral   Take 1 tablet (500 mg total) by mouth 2 (two) times daily.   30 tablet   0   . sulfamethoxazole-trimethoprim (SEPTRA DS) 800-160 MG per tablet   Oral   Take 1 tablet by mouth every 12 (twelve) hours.   20 tablet   0    BP 104/67  Pulse 79  Temp(Src) 97.4 F (36.3 C) (Oral)  Resp 18  Ht 5\' 6"  (1.676 m)  Wt 174 lb (78.926 kg)  BMI 28.10 kg/m2  SpO2 100%  LMP 09/03/2013  Physical Exam  Nursing note and vitals reviewed. Constitutional: She is oriented to person, place, and time. She appears well-developed and well-nourished.  HENT:  Head: Normocephalic and atraumatic.  Eyes: EOM are normal.  Neck: Normal range of motion.  Cardiovascular: Normal rate.   Pulmonary/Chest: Effort normal.  Musculoskeletal: Normal range of motion.  Neurological: She is  alert and oriented to person, place, and time.  Skin: Skin is warm and dry.  3 cm area of erythema and induration with tenderness in the left axilla. No warmth or red streaking. No active drainage.  Psychiatric: She has a normal mood and affect. Her behavior is normal.    ED Course  Procedures (including critical care time) DIAGNOSTIC STUDIES: Oxygen Saturation is 99% on RA, normal by my interpretation.    COORDINATION OF CARE: 5:00 PM Discussed treatment plan which includes I&D and 800 mg ibuprofen in ED with pt at bedside and pt agreed to plan. Abscesses discussed. Resource guide given. General surgery referral given. Advised pt to follow up in 2 days if problem persists. Will discharge home with 800-160 mg Septra tablets, 5-325 mg norco tablets, and 500 mg naproxen  tablet. Advised pt to use warm compresses for several days and to clean area with soap and water.   INCISION AND DRAINAGE Performed by: Junius FinnerErin O'Malley, PA-C Consent: Verbal consent obtained. Risks and benefits: risks, benefits and alternatives were discussed Type: abscess  Body area: left axilla  Anesthesia: local infiltration  Incision was made with a scalpel.  Local anesthetic: lidocaine 2% with epinephrine  Anesthetic total: 1 ml  Complexity: complex Blunt dissection to break up loculations  Drainage: purulent  Drainage amount: scant, bloody  Packing material: none  Patient tolerance: Patient tolerated the procedure well with no immediate complications.      Labs Review Labs Reviewed - No data to display Imaging Review No results found.  EKG Interpretation   None       MDM   1. Abscess of axilla, left    Abscess of left axilla, I&D see above. No immediate complications. No red streaking. Vitals: WNL. Pt /co persistent abscess in same area, will also prescribe Rx: bactrim. F/u in 2 days with PCP or return to ED for recheck. Return precautions provided. Pt verbalized understanding and agreement with tx plan.   I personally performed the services described in this documentation, which was scribed in my presence. The recorded information has been reviewed and is accurate.    Junius FinnerErin O'Malley, PA-C 09/10/13 2353

## 2013-09-10 NOTE — ED Notes (Signed)
Pt states shes been dealing with reoccurring boils under left armpit since December. Pt states its come up and down with pus, never really went away. Pt states shes attempted to drain it without much success. Small reddened area noted to left armpit.

## 2013-09-10 NOTE — Discharge Instructions (Signed)
Abscess An abscess (boil or furuncle) is an infected area on or under the skin. This area is filled with yellowish-white fluid (pus) and other material (debris). HOME CARE  Abscess Care After An abscess (also called a boil or furuncle) is an infected area that contains a collection of pus. Signs and symptoms of an abscess include pain, tenderness, redness, or hardness, or you may feel a moveable soft area under your skin. An abscess can occur anywhere in the body. The infection may spread to surrounding tissues causing cellulitis. A cut (incision) by the surgeon was made over your abscess and the pus was drained out. Gauze may have been packed into the space to provide a drain that will allow the cavity to heal from the inside outwards. The boil may be painful for 5 to 7 days. Most people with a boil do not have high fevers. Your abscess, if seen early, may not have localized, and may not have been lanced. If not, another appointment may be required for this if it does not get better on its own or with medications. HOME CARE INSTRUCTIONS   Only take over-the-counter or prescription medicines for pain, discomfort, or fever as directed by your caregiver.  When you bathe, soak and then remove gauze or iodoform packs at least daily or as directed by your caregiver. You may then wash the wound gently with mild soapy water. Repack with gauze or do as your caregiver directs. SEEK IMMEDIATE MEDICAL CARE IF:   You develop increased pain, swelling, redness, drainage, or bleeding in the wound site.  You develop signs of generalized infection including muscle aches, chills, fever, or a general ill feeling.  An oral temperature above 102 F (38.9 C) develops, not controlled by medication. See your caregiver for a recheck if you develop any of the symptoms described above. If medications (antibiotics) were prescribed, take them as directed. Document Released: 02/26/2005 Document Revised: 11/02/2011 Document  Reviewed: 10/24/2007 [facility] Patient Information 2014 [place]ExitCare, [place]LL[place].   Only take medicines as told by your doctor.  If you were given antibiotic medicine, take it as directed. Finish the medicine even if you start to feel better.  If gauze is used, follow your doctor's directions for changing the gauze.  To avoid spreading the infection:  Keep your abscess covered with a bandage.  Wash your hands well.  Do not share personal care items, towels, or whirlpools with others.  Avoid skin contact with others.  Keep your skin and clothes clean around the abscess.  Keep all doctor visits as told. GET HELP RIGHT AWAY IF:   You have more pain, puffiness (swelling), or redness in the wound site.  You have more fluid or blood coming from the wound site.  You have muscle aches, chills, or you feel sick.  You have a fever. MAKE SURE YOU:   Understand these instructions.  Will watch your condition.  Will get help right away if you are not doing well or get worse. Document Released: 01/27/2008 Document Revised: 02/09/2012 Document Reviewed: 10/23/2011 [facility] Patient Information 2014 [place] LakeExitCare, [place]LLC.

## 2013-09-11 NOTE — ED Provider Notes (Signed)
Medical screening examination/treatment/procedure(s) were performed by non-physician practitioner and as supervising physician I was immediately available for consultation/collaboration.  EKG Interpretation   None         Alyn Riedinger M Capri Raben, DO 09/11/13 1005 

## 2013-12-08 ENCOUNTER — Encounter (HOSPITAL_COMMUNITY): Payer: Self-pay | Admitting: Emergency Medicine

## 2013-12-08 ENCOUNTER — Emergency Department (HOSPITAL_COMMUNITY)
Admission: EM | Admit: 2013-12-08 | Discharge: 2013-12-08 | Disposition: A | Discharge status: Home / Self Care | Payer: No Typology Code available for payment source | Attending: Emergency Medicine | Admitting: Emergency Medicine

## 2013-12-08 ENCOUNTER — Emergency Department (HOSPITAL_COMMUNITY): Payer: No Typology Code available for payment source

## 2013-12-08 DIAGNOSIS — F329 Major depressive disorder, single episode, unspecified: Secondary | ICD-10-CM | POA: Insufficient documentation

## 2013-12-08 DIAGNOSIS — F17200 Nicotine dependence, unspecified, uncomplicated: Secondary | ICD-10-CM | POA: Insufficient documentation

## 2013-12-08 DIAGNOSIS — S7002XA Contusion of left hip, initial encounter: Secondary | ICD-10-CM

## 2013-12-08 DIAGNOSIS — Y92410 Unspecified street and highway as the place of occurrence of the external cause: Secondary | ICD-10-CM | POA: Insufficient documentation

## 2013-12-08 DIAGNOSIS — S39012A Strain of muscle, fascia and tendon of lower back, initial encounter: Secondary | ICD-10-CM

## 2013-12-08 DIAGNOSIS — R0789 Other chest pain: Secondary | ICD-10-CM

## 2013-12-08 DIAGNOSIS — Y9389 Activity, other specified: Secondary | ICD-10-CM | POA: Insufficient documentation

## 2013-12-08 DIAGNOSIS — S335XXA Sprain of ligaments of lumbar spine, initial encounter: Secondary | ICD-10-CM | POA: Insufficient documentation

## 2013-12-08 DIAGNOSIS — S298XXA Other specified injuries of thorax, initial encounter: Secondary | ICD-10-CM | POA: Insufficient documentation

## 2013-12-08 DIAGNOSIS — S7000XA Contusion of unspecified hip, initial encounter: Secondary | ICD-10-CM | POA: Insufficient documentation

## 2013-12-08 DIAGNOSIS — Z79899 Other long term (current) drug therapy: Secondary | ICD-10-CM | POA: Insufficient documentation

## 2013-12-08 DIAGNOSIS — F3289 Other specified depressive episodes: Secondary | ICD-10-CM | POA: Insufficient documentation

## 2013-12-08 MED ORDER — NAPROXEN 500 MG PO TABS
500.0000 mg | ORAL_TABLET | Freq: Once | ORAL | Status: AC
  Administered 2013-12-08: 500 mg via ORAL
  Filled 2013-12-08: qty 1

## 2013-12-08 MED ORDER — NAPROXEN 500 MG PO TABS: 500.0000 mg | ORAL_TABLET | Freq: Two times a day (BID) | ORAL | Status: DC

## 2013-12-08 MED ORDER — HYDROCODONE-ACETAMINOPHEN 5-325 MG PO TABS
2.0000 | ORAL_TABLET | Freq: Once | ORAL | Status: AC
  Administered 2013-12-08: 2 via ORAL
  Filled 2013-12-08: qty 2

## 2013-12-08 MED ORDER — METHOCARBAMOL 500 MG PO TABS
500.0000 mg | ORAL_TABLET | Freq: Once | ORAL | Status: AC
  Administered 2013-12-08: 500 mg via ORAL
  Filled 2013-12-08: qty 1

## 2013-12-08 MED ORDER — METHOCARBAMOL 500 MG PO TABS: 500.0000 mg | ORAL_TABLET | Freq: Two times a day (BID) | ORAL | Status: DC
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
## 2013-12-08 NOTE — Discharge Instructions (Signed)
Back Pain, Adult Low back pain is very common. About 1 in 5 people have back pain.The cause of low back pain is rarely dangerous. The pain often gets better over time.About half of people with a sudden onset of back pain feel better in just 2 weeks. About 8 in 10 people feel better by 6 weeks.  CAUSES Some common causes of back pain include:  Strain of the muscles or ligaments supporting the spine.  Wear and tear (degeneration) of the spinal discs.  Arthritis.  Direct injury to the back. DIAGNOSIS Most of the time, the direct cause of low back pain is not known.However, back pain can be treated effectively even when the exact cause of the pain is unknown.Answering your caregiver's questions about your overall health and symptoms is one of the most accurate ways to make sure the cause of your pain is not dangerous. If your caregiver needs more information, he or she may order lab work or imaging tests (X-rays or MRIs).However, even if imaging tests show changes in your back, this usually does not require surgery. HOME CARE INSTRUCTIONS For many people, back pain returns.Since low back pain is rarely dangerous, it is often a condition that people can learn to [facility] their own.   Remain active. It is stressful on the back to sit or stand in one place. Do not sit, drive, or stand in one place for more than 30 minutes at a time. Take short walks on level surfaces as soon as pain allows.Try to increase the length of time you walk each day.  Do not stay in bed.Resting more than 1 or 2 days can delay your recovery.  Do not avoid exercise or work.Your body is made to move.It is not dangerous to be active, even though your back may hurt.Your back will likely heal faster if you return to being active before your pain is gone.  Pay attention to your body when you bend and lift. Many people have less discomfortwhen lifting if they bend their knees, keep the load close to their bodies,and  avoid twisting. Often, the most comfortable positions are those that put less stress on your recovering back.  Find a comfortable position to sleep. Use a firm mattress and lie on your side with your knees slightly bent. If you lie on your back, put a pillow under your knees.  Only take over-the-counter or prescription medicines as directed by your caregiver. Over-the-counter medicines to reduce pain and inflammation are often the most helpful.Your caregiver may prescribe muscle relaxant drugs.These medicines help dull your pain so you can more quickly return to your normal activities and healthy exercise.  Put ice on the injured area.  Put ice in a plastic bag.  Place a towel between your skin and the bag.  Leave the ice on for 15-20 minutes, 03-04 times a day for the first 2 to 3 days. After that, ice and heat may be alternated to reduce pain and spasms.  Ask your caregiver about trying back exercises and gentle massage. This may be of some benefit.  Avoid feeling anxious or stressed.Stress increases muscle tension and can worsen back pain.It is important to recognize when you are anxious or stressed and learn ways to manage it.Exercise is a great option. SEEK MEDICAL CARE IF:  You have pain that is not relieved with rest or medicine.  You have pain that does not improve in 1 week.  You have new symptoms.  You are generally not feeling well. SEEK  IMMEDIATE MEDICAL CARE IF:   You have pain that radiates from your back into your legs.  You develop new bowel or bladder control problems.  You have unusual weakness or numbness in your arms or legs.  You develop nausea or vomiting.  You develop abdominal pain.  You feel faint. Document Released: 08/10/2005 Document Revised: 02/09/2012 Document Reviewed: 12/29/2010 [facility]hamExitCare Patient Information 2014 [place]ExitCare, [place]LL[place]. Muscle Strain A muscle strain is an injury that occurs when a muscle is stretched beyond its normal length.  Usually a small number of muscle fibers are torn when this happens. Muscle strain is rated in degrees. First-degree strains have the least amount of muscle fiber tearing and pain. Second-degree and third-degree strains have increasingly more tearing and pain.  Usually, recovery from muscle strain takes 1 2 weeks. Complete healing takes 5 6 weeks.  CAUSES  Muscle strain happens when a sudden, violent force placed on a muscle stretches it too far. This may occur with lifting, sports, or a fall.  RISK FACTORS Muscle strain is especially common in athletes.  SIGNS AND SYMPTOMS At the site of the muscle strain, there may be:  Pain.  Bruising.  Swelling.  Difficulty using the muscle due to pain or lack of normal function. DIAGNOSIS  Your health care provider will perform a physical exam and ask about your medical history. TREATMENT  Often, the best treatment for a muscle strain is resting, icing, and applying cold compresses to the injured area.  HOME CARE INSTRUCTIONS   Use the PRICE method of treatment to promote muscle healing during the first 2 3 days after your injury. The PRICE method involves:  Protecting the muscle from being injured again.  Restricting your activity and resting the injured body part.  Icing your injury. To do this, put ice in a plastic bag. Place a towel between your skin and the bag. Then, apply the ice and leave it on from 15 20 minutes each hour. After the third day, switch to moist heat packs.  Apply compression to the injured area with a splint or elastic bandage. Be careful not to wrap it too tightly. This may interfere with blood circulation or increase swelling.  Elevate the injured body part above the level of your heart as often as you can.  Only take over-the-counter or prescription medicines for pain, discomfort, or fever as directed by your health care provider.  Warming up prior to exercise helps to prevent future muscle strains. SEEK MEDICAL  CARE IF:   You have increasing pain or swelling in the injured area.  You have numbness, tingling, or a significant loss of strength in the injured area. MAKE SURE YOU:   Understand these instructions.  Will watch your condition.  Will get help right away if you are not doing well or get worse. Document Released: 08/10/2005 Document Revised: 05/31/2013 Document Reviewed: 03/09/2013 [facility]ExitCare Patient Information 2014 [place]ExitCare, [place]LLC. Contusion A contusion is a deep bruise. Contusions are the result of an injury that caused bleeding under the skin. The contusion may turn blue, purple, or yellow. Minor injuries will give you a painless contusion, but more severe contusions may stay painful and swollen for a few weeks.  CAUSES  A contusion is usually caused by a blow, trauma, or direct force to an area of the body. SYMPTOMS   Swelling and redness of the injured area.  Bruising of the injured area.  Tenderness and soreness of the injured area.  Pain. DIAGNOSIS  The diagnosis can be  made by taking a history and physical exam. An X-ray, CT scan, or MRI may be needed to determine if there were any associated injuries, such as fractures. TREATMENT  Specific treatment will depend on what area of the body was injured. In general, the best treatment for a contusion is resting, icing, elevating, and applying cold compresses to the injured area. Over-the-counter medicines may also be recommended for pain control. Ask your caregiver what the best treatment is for your contusion. HOME CARE INSTRUCTIONS   Put ice on the injured area.  Put ice in a plastic bag.  Place a towel between your skin and the bag.  Leave the ice on for 15-20 minutes, 03-04 times a day.  Only take over-the-counter or prescription medicines for pain, discomfort, or fever as directed by your caregiver. Your caregiver may recommend avoiding anti-inflammatory medicines (aspirin, ibuprofen, and naproxen) for 48 hours because  these medicines may increase bruising.  Rest the injured area.  If possible, elevate the injured area to reduce swelling. SEEK IMMEDIATE MEDICAL CARE IF:   You have increased bruising or swelling.  You have pain that is getting worse.  Your swelling or pain is not relieved with medicines. MAKE SURE YOU:   Understand these instructions.  Will watch your condition.  Will get help right away if you are not doing well or get worse. Document Released: 05/20/2005 Document Revised: 11/02/2011 Document Reviewed: 06/15/2011 [facility]Exit[facility] Patient Information 2014 [place] CityExitCare, [place]LLC.
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
## 2013-12-09 NOTE — ED Provider Notes (Signed)
Medical screening examination/treatment/procedure(s) were performed by non-physician practitioner and as supervising physician I was immediately available for consultation/collaboration.   EKG Interpretation None        Marija Calamari, MD 12/09/13 2328 

## 2014-09-27 ENCOUNTER — Emergency Department (HOSPITAL_COMMUNITY)
Admission: EM | Admit: 2014-09-27 | Discharge: 2014-09-27 | Disposition: A | Discharge status: Home / Self Care | Payer: No Typology Code available for payment source | Attending: Emergency Medicine | Admitting: Emergency Medicine

## 2014-09-27 ENCOUNTER — Encounter (HOSPITAL_COMMUNITY): Payer: Self-pay | Admitting: Emergency Medicine

## 2014-09-27 ENCOUNTER — Emergency Department (HOSPITAL_COMMUNITY): Payer: No Typology Code available for payment source

## 2014-09-27 DIAGNOSIS — Z791 Long term (current) use of non-steroidal anti-inflammatories (NSAID): Secondary | ICD-10-CM | POA: Diagnosis not present

## 2014-09-27 DIAGNOSIS — F329 Major depressive disorder, single episode, unspecified: Secondary | ICD-10-CM | POA: Diagnosis not present

## 2014-09-27 DIAGNOSIS — S4992XA Unspecified injury of left shoulder and upper arm, initial encounter: Secondary | ICD-10-CM | POA: Insufficient documentation

## 2014-09-27 DIAGNOSIS — Y92410 Unspecified street and highway as the place of occurrence of the external cause: Secondary | ICD-10-CM | POA: Diagnosis not present

## 2014-09-27 DIAGNOSIS — Z79899 Other long term (current) drug therapy: Secondary | ICD-10-CM | POA: Diagnosis not present

## 2014-09-27 DIAGNOSIS — M542 Cervicalgia: Secondary | ICD-10-CM

## 2014-09-27 DIAGNOSIS — Z792 Long term (current) use of antibiotics: Secondary | ICD-10-CM | POA: Diagnosis not present

## 2014-09-27 DIAGNOSIS — Y9389 Activity, other specified: Secondary | ICD-10-CM | POA: Insufficient documentation

## 2014-09-27 DIAGNOSIS — S199XXA Unspecified injury of neck, initial encounter: Secondary | ICD-10-CM | POA: Insufficient documentation

## 2014-09-27 DIAGNOSIS — Y998 Other external cause status: Secondary | ICD-10-CM | POA: Insufficient documentation

## 2014-09-27 DIAGNOSIS — M25512 Pain in left shoulder: Secondary | ICD-10-CM

## 2014-09-27 DIAGNOSIS — Z720 Tobacco use: Secondary | ICD-10-CM | POA: Insufficient documentation

## 2014-09-27 MED ORDER — LORAZEPAM 1 MG PO TABS
1.0000 mg | ORAL_TABLET | Freq: Once | ORAL | Status: AC
  Administered 2014-09-27: 1 mg via ORAL
  Filled 2014-09-27: qty 1

## 2014-09-27 MED ORDER — METHOCARBAMOL 500 MG PO TABS
750.0000 mg | ORAL_TABLET | Freq: Once | ORAL | Status: AC
  Administered 2014-09-27: 750 mg via ORAL
  Filled 2014-09-27: qty 2

## 2014-09-27 NOTE — Discharge Instructions (Signed)
Return to the emergency room with worsening of symptoms, new symptoms or with symptoms that are concerning, especially worsening pain, you feel overwhelmed, want to harm yourself or others, numbness, tingling weakness in left arm, severe headache, visual changes, slurred speech. RICE: Rest, Ice (three cycles of 20 mins on, off at least twice a day), compression/brace, elevation. Heating pad works well for back pain. Ibuprofen  (2 tablets ) every 5-6 hours for 3-5 days  Follow up with PCP if symptoms worsen or are persistent.  Use below resources to establish care with counselor or psychologist for your anxiety. Please call your doctor for a followup appointment within 24-48 hours. When you talk to your doctor please let them know that you were seen in the emergency department and have them acquire all of your records so that they can discuss the findings with you and formulate a treatment plan to fully care for your new and ongoing problems. If you do not have a primary care provider please call the number below under ED resources to establish care with a provider and follow up.    Emergency Department Resource Guide 1) Find a Doctor and Pay Out of Pocket Although you won't have to find out who is covered by your insurance plan, it is a good idea to ask around and get recommendations. You will then need to call the office and see if the doctor you have chosen will accept you as a new patient and what types of options they offer for patients who are self-pay. Some doctors offer discounts or will set up payment plans for their patients who do not have insurance, but you will need to ask so you aren't surprised when you get to your appointment.  2) Contact Your Local Health Department Not all health departments have doctors that can see patients for sick visits, but many do, so it is worth a call to see if yours does. If you don't know where your local health department is, you can check  in your phone book. The CDC also has a tool to help you locate your state's health department, and many state websites also have listings of all of their local health departments.  3) Find a Walk-in Clinic If your illness is not likely to be very severe or complicated, you may want to try a walk in clinic. These are popping up all over the country in pharmacies, drugstores, and shopping centers. They're usually staffed by nurse practitioners or physician assistants that have been trained to treat common illnesses and complaints. They're usually fairly quick and inexpensive. However, if you have serious medical issues or chronic medical problems, these are probably not your best option.  No Primary Care Doctor: - Call Health Connect at  [phone] - they can help you locate a primary care doctor that  accepts your insurance, provides certain services, etc. - Physician Referral Service- [phone]  Chronic Pain Problems: Organization         Address  Phone   Notes  [facility]  [phone] Patients need to be referred by their primary care doctor.   Medication Assistance: Organization         Address  Phone   Notes  [facility] Medication [facility] [address] [place], [place] [address] [phone] --Must be a resident of [facility] -- Must have NO insurance coverage whatsoever (no Medicaid/ Medicare, etc.) -- The pt. MUST have a primary care doctor that  directs their care regularly and follows them in the community   [facility]ist  [phone]   [employer]  [phone]    Agencies that provide inexpensive medical care: Organization         Address  Phone   Notes  [place]  [phone]   [facility]    [phone]   [facility] [place], [place] [address] [phone]   [place] [address] [place] [phone]    Planned Parenthood    [phone]   [facility]    [phone]   [place] [address], [place] Phone:  [phone], Fax:  [phone] Hours of Operation:  9 am - 6 pm, M-F.  Also accepts Medicaid/Medicare and self-pay.  [facility], [address], [place] Phone: [phone], Fax: [phone]. Hours of Operation:  8:30 am - 5:30 pm, M-F.  Also accepts Medicaid and self-pay.  [facility] [address] [place] Phone: [phone]   [place], [place], [place] [phone], Ext. 123 Mondays & Thursdays: 7-9 AM.  First 15 patients are seen on a first come, first serve basis.    [place] Providers:  Organization         Address  Phone   Notes  [facility] [address], [place] [phone] Also accepts self-pay patients.  [facility] [address], [place]  [phone]   [place], [address], [place] [phone]   [facility] [address] [place] [phone]   Lucianne Lei [address], [place]   [phone] Only accepts [place] Access [place] patients after they have their name applied to their card.   Self-Pay (no insurance) in [facility]:  Organization         Address  Phone   Notes  Sickle Cell Patients, [facility] [place] [phone]   [facility] [place] [phone]   [facility] [place]  [place], [place], [place] [phone]   [facility]/Dr. Osei-Bonsu  [address] [place] or [address], [place] [phone] Phone number for both [place] and [place] locations is the  same.  [facility] [address] [place] [phone]   [facility] [address] [place] or [address] [phone]   [facility] [address], [place] [phone], phone; [phone], fax Sees patients 1st and 3rd Saturday of every month.  Must not qualify for public or private insurance (i.e. Medicaid, Medicare, [place] Health Choice, Veterans' Benefits)  Household income should be no more than 200% of the poverty level The clinic cannot treat you if you are pregnant or think you are pregnant  Sexually transmitted diseases are not treated at the clinic.    Dental Care: Organization         Address  Phone  Notes  [facility] [address] [place], [place] [phone] Accepts children up  to age 36 who are enrolled in Medicaid or [place] Health Choice; pregnant women with a Medicaid card; and children who have applied for Medicaid or [place] Health Choice, but were declined, whose parents can pay a reduced fee at time of service.  [facility] Department of [facility]  [address], [place] [phone] Accepts children up to age 46 who are enrolled in [place] or [place]; pregnant women with a Medicaid card; and children who have applied for Medicaid or [place] Health Choice, but were declined, whose parents can pay a reduced fee at time of service.  [place] Adult [facility] Access PRO[facility]  [place] [phone] Patients are seen by appointment only. Walk-ins are not accepted. [place] will see patients 1 years of age and older. Monday - Tuesday (8am-5pm) Most Wednesdays (8:30-5pm) $30 per visit, cash only  [facility] Adult Dental Access PROGRAM  [address], [facility] [phone] Patients are seen by appointment only. Walk-ins are not accepted. [place] will see patients  63 years of age and older. One Wednesday Evening (Monthly: Volunteer Based).  $30 per visit, cash only  [place]  [phone] for adults; Children under age 69, call Graduate Pediatric Dentistry at [phone]. Children aged 67-14, please call [phone] to request a pediatric application.  Dental services are provided in all areas of dental care including fillings, crowns and bridges, complete and partial dentures, implants, gum treatment, root canals, and extractions. Preventive care is also provided. Treatment is provided to both adults and children. Patients are selected via a lottery and there is often a waiting list.   [facility] [address] [place]  [phone] [email or web address]   [facility] [address] [place], [place] [phone], Ext. [phone] Second and Fourth Thursday of each month, opens at 6:30 AM; Clinic ends at 9 AM.  Patients are seen on a first-come first-served basis, and a limited number are seen during each clinic.   [facility]  [address] Hillard Danker [place], [place] [phone]   Eligibility Requirements You must have lived in [place], [place], or [place] counties for at least the last three months.   You cannot be eligible for state or federal sponsored [employer], including [employer], [place], or [employer].   You generally cannot be eligible for healthcare insurance through your employer.    How to apply: Eligibility screenings are held every Tuesday and Wednesday afternoon from 1:00 pm until 4:00 pm. You do not need an appointment for the interview!  [facility] [address], [place], [place]   [place]  [place] Department  [place]  [phone]    Behavioral Health Resources in the Community: Intensive Outpatient  Programs Organization         Address  Phone  Notes  [place] [facility] [place]. [address], [place], [place] [phone]   [facility] Outpatient [address] [place], [place]   ADS: Alcohol & Drug Svcs [address] [place], [place]   [place] [address],  [place], [place] or [phone]   Substance Abuse Resources Organization         Address  Phone  Notes  Alcohol and Drug Services  [phone]   [facility]  [phone]   [facility]  [phone]   [facility]  [phone]   Residential & Outpatient Substance Abuse Program  [phone]   Psychological Services Organization         Address  Phone  Notes  [facility]  [phone]- [phone]   [facility]  [phone]- [phone]   [facility] [address], [place] [phone] or [phone]    Mobile Crisis Teams Organization         Address  Phone  Notes  [facility], Mobile Crisis Care Unit  [phone]   [facility]  [address] [place], [place] [phone]   Doristine LocksSharon DeEsch [address] [place] [phone]    Self-Help/Support Groups Organization         Address  Phone             Notes  [facility] [place] - variety of support groups  336- [phone] Call for more information  Narcotics Anonymous (NA), [facility] [address], [place]  2 meetings at this location   Statisticianesidential Treatment Programs Organization         Address  Phone  Notes  [facility] [address],    [place]  [phone]   [facility]  [address], [place] [phone], [place], [place] [phone]   [facility] [address], [place] [phone] Admissions: 8am-3pm M-F  [facility] [address]    [place], [place]  [phone]   [facility] [address], [place], [place] [phone]   [facility] [address]  [place], [place] [phone]   [facility] - Intensive Outpatient [address], [place], [place] [phone]   [facility] ([facility] [address]  [place], [place] [phone] or [phone]   [facility] (RT[facility]) [address] [place], [place] [phone] Accepts Medicaid  [facility] [address]  [place] [phone] Substance Abuse/Addiction Treatment   [facility] Behavioral Health Resources Organization         Address  Phone  Notes  [facility]  [phone](888) [phone]   Angie FavaJulie Brannon, PhD [address] [place], [place]   [phone] or [phone](336) [phone]   [facility]   [address] [place], [place] [phone](336) [phone]   [facility] [address], [place], [place] [phone]) [phone] Insurance/Medicaid/sponsorship through [facility]  [facility]ith [facility] Families [address]                                    [place], [place] [phone] Therapy/tele-psych/case  [facility] [address]   [place], [place] [phone]) [phone]    Dr. Lolly MustacheArfeen  [phone]   [facility]  [employer] [facility] 1) [address], [place] 2) [address], [place] 3)  [address], [place] [phone](336) [phone]  [phone]   [facility] Child Abuse Hotline [phone](336) [phone] or [phone](336) [phone] (After Hours)
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
## 2015-01-02 ENCOUNTER — Emergency Department (HOSPITAL_COMMUNITY)
Admission: EM | Admit: 2015-01-02 | Discharge: 2015-01-03 | Disposition: A | Discharge status: Other Acute Inpatient Hospital | Payer: Medicaid Other | Attending: Emergency Medicine | Admitting: Emergency Medicine

## 2015-01-02 ENCOUNTER — Encounter (HOSPITAL_COMMUNITY): Payer: Self-pay | Admitting: Family Medicine

## 2015-01-02 ENCOUNTER — Emergency Department (HOSPITAL_COMMUNITY): Payer: Medicaid Other

## 2015-01-02 DIAGNOSIS — Z79899 Other long term (current) drug therapy: Secondary | ICD-10-CM | POA: Diagnosis not present

## 2015-01-02 DIAGNOSIS — F1210 Cannabis abuse, uncomplicated: Secondary | ICD-10-CM | POA: Diagnosis not present

## 2015-01-02 DIAGNOSIS — S99922A Unspecified injury of left foot, initial encounter: Secondary | ICD-10-CM | POA: Diagnosis not present

## 2015-01-02 DIAGNOSIS — F1310 Sedative, hypnotic or anxiolytic abuse, uncomplicated: Secondary | ICD-10-CM | POA: Diagnosis not present

## 2015-01-02 DIAGNOSIS — Y9389 Activity, other specified: Secondary | ICD-10-CM | POA: Insufficient documentation

## 2015-01-02 DIAGNOSIS — Z791 Long term (current) use of non-steroidal anti-inflammatories (NSAID): Secondary | ICD-10-CM | POA: Insufficient documentation

## 2015-01-02 DIAGNOSIS — M79672 Pain in left foot: Secondary | ICD-10-CM

## 2015-01-02 DIAGNOSIS — Y9289 Other specified places as the place of occurrence of the external cause: Secondary | ICD-10-CM | POA: Diagnosis not present

## 2015-01-02 DIAGNOSIS — Z720 Tobacco use: Secondary | ICD-10-CM | POA: Insufficient documentation

## 2015-01-02 DIAGNOSIS — W1839XA Other fall on same level, initial encounter: Secondary | ICD-10-CM | POA: Insufficient documentation

## 2015-01-02 DIAGNOSIS — R45851 Suicidal ideations: Secondary | ICD-10-CM

## 2015-01-02 DIAGNOSIS — F329 Major depressive disorder, single episode, unspecified: Secondary | ICD-10-CM | POA: Diagnosis not present

## 2015-01-02 DIAGNOSIS — Y998 Other external cause status: Secondary | ICD-10-CM | POA: Diagnosis not present

## 2015-01-02 DIAGNOSIS — F1410 Cocaine abuse, uncomplicated: Secondary | ICD-10-CM | POA: Insufficient documentation

## 2015-01-02 DIAGNOSIS — F1110 Opioid abuse, uncomplicated: Secondary | ICD-10-CM | POA: Insufficient documentation

## 2015-01-02 HISTORY — DX: Anxiety disorder, unspecified: F41.9

## 2015-01-02 LAB — COMPREHENSIVE METABOLIC PANEL
ALT: 20 U/L (ref 14–54)
AST: 23 U/L (ref 15–41)
Albumin: 3.7 g/dL (ref 3.5–5.0)
Alkaline Phosphatase: 60 U/L (ref 38–126)
Anion gap: 9 (ref 5–15)
BILIRUBIN TOTAL: 0.2 mg/dL — AB (ref 0.3–1.2)
BUN: 12 mg/dL (ref 6–20)
CHLORIDE: 106 mmol/L (ref 101–111)
CO2: 23 mmol/L (ref 22–32)
Calcium: 8.8 mg/dL — ABNORMAL LOW (ref 8.9–10.3)
Creatinine, Ser: 0.67 mg/dL (ref 0.44–1.00)
GFR calc Af Amer: >60 mL/min (ref >60)
Glucose, Bld: 87 mg/dL (ref 70–99)
POTASSIUM: 4 mmol/L (ref 3.5–5.1)
SODIUM: 138 mmol/L (ref 135–145)
Total Protein: 7.2 g/dL (ref 6.5–8.1)

## 2015-01-02 LAB — RAPID URINE DRUG SCREEN, HOSP PERFORMED
AMPHETAMINES: NOT DETECTED
BARBITURATES: NOT DETECTED
BENZODIAZEPINES: POSITIVE — AB
Cocaine: POSITIVE — AB
Opiates: POSITIVE — AB
Tetrahydrocannabinol: POSITIVE — AB

## 2015-01-02 LAB — CBC
HCT: 37.8 % (ref 36.0–46.0)
Hemoglobin: 12 g/dL (ref 12.0–15.0)
MCH: 26.1 pg (ref 26.0–34.0)
MCHC: 31.7 g/dL (ref 30.0–36.0)
MCV: 82.2 fL (ref 78.0–100.0)
PLATELETS: 254 10*3/uL (ref 150–400)
RBC: 4.6 MIL/uL (ref 3.87–5.11)
RDW: 18.1 % — AB (ref 11.5–15.5)
WBC: 8.6 10*3/uL (ref 4.0–10.5)

## 2015-01-02 LAB — ACETAMINOPHEN LEVEL

## 2015-01-02 LAB — ETHANOL: Alcohol, Ethyl (B): <5 mg/dL (ref <5)

## 2015-01-02 LAB — SALICYLATE LEVEL: Salicylate Lvl: <4 mg/dL (ref 2.8–30.0)

## 2015-01-02 MED ORDER — NAPROXEN 250 MG PO TABS
500.0000 mg | ORAL_TABLET | Freq: Two times a day (BID) | ORAL | Status: DC
  Administered 2015-01-02 – 2015-01-03 (×2): 500 mg via ORAL
  Filled 2015-01-02 (×2): qty 2

## 2015-01-02 MED ORDER — DICYCLOMINE HCL 20 MG PO TABS: 20.0000 mg | ORAL_TABLET | Freq: Four times a day (QID) | ORAL | Status: DC | PRN

## 2015-01-02 MED ORDER — METHOCARBAMOL 500 MG PO TABS: 500.0000 mg | ORAL_TABLET | Freq: Two times a day (BID) | ORAL | Status: DC | PRN

## 2015-01-02 MED ORDER — LOPERAMIDE HCL 2 MG PO CAPS: 2.0000 mg | ORAL_CAPSULE | ORAL | Status: DC | PRN

## 2015-01-02 MED ORDER — GABAPENTIN 300 MG PO CAPS
600.0000 mg | ORAL_CAPSULE | Freq: Two times a day (BID) | ORAL | Status: DC
  Administered 2015-01-02 – 2015-01-03 (×2): 600 mg via ORAL
  Filled 2015-01-02 (×2): qty 2

## 2015-01-02 MED ORDER — HYDROXYZINE HCL 25 MG PO TABS: 25.0000 mg | ORAL_TABLET | Freq: Four times a day (QID) | ORAL | Status: DC | PRN

## 2015-01-02 MED ORDER — NICOTINE 21 MG/24HR TD PT24
21.0000 mg | MEDICATED_PATCH | Freq: Once | TRANSDERMAL | Status: DC
  Administered 2015-01-02: 21 mg via TRANSDERMAL
  Filled 2015-01-02: qty 1

## 2015-01-02 MED ORDER — ALUM & MAG HYDROXIDE-SIMETH 200-200-20 MG/5ML PO SUSP: 30.0000 mL | ORAL | Status: DC | PRN

## 2015-01-02 MED ORDER — BUSPIRONE HCL 10 MG PO TABS
30.0000 mg | ORAL_TABLET | Freq: Two times a day (BID) | ORAL | Status: DC
  Administered 2015-01-02 – 2015-01-03 (×2): 30 mg via ORAL
  Filled 2015-01-02 (×2): qty 3

## 2015-01-02 MED ORDER — ONDANSETRON HCL 4 MG PO TABS: 4.0000 mg | ORAL_TABLET | Freq: Three times a day (TID) | ORAL | Status: DC | PRN

## 2015-01-02 MED ORDER — IBUPROFEN 400 MG PO TABS
600.0000 mg | ORAL_TABLET | Freq: Three times a day (TID) | ORAL | Status: DC | PRN
  Administered 2015-01-02: 600 mg via ORAL
  Filled 2015-01-02 (×2): qty 1

## 2015-01-02 MED ORDER — LAMOTRIGINE 25 MG PO TABS
25.0000 mg | ORAL_TABLET | Freq: Every day | ORAL | Status: DC
  Administered 2015-01-02: 25 mg via ORAL
  Filled 2015-01-02 (×2): qty 1

## 2015-01-02 MED ORDER — BUPROPION HCL ER (SR) 150 MG PO TB12
150.0000 mg | ORAL_TABLET | Freq: Two times a day (BID) | ORAL | Status: DC
  Administered 2015-01-02 – 2015-01-03 (×2): 150 mg via ORAL
  Filled 2015-01-02 (×3): qty 1

## 2015-01-02 MED ORDER — ZOLPIDEM TARTRATE 5 MG PO TABS: 5.0000 mg | ORAL_TABLET | Freq: Every evening | ORAL | Status: DC | PRN

## 2015-01-02 MED ORDER — ACETAMINOPHEN 325 MG PO TABS: 650.0000 mg | ORAL_TABLET | ORAL | Status: DC | PRN

## 2015-01-02 MED ORDER — ONDANSETRON 4 MG PO TBDP: 4.0000 mg | ORAL_TABLET | Freq: Four times a day (QID) | ORAL | Status: DC | PRN

## 2015-01-02 MED ORDER — LORAZEPAM 1 MG PO TABS
1.0000 mg | ORAL_TABLET | Freq: Three times a day (TID) | ORAL | Status: DC | PRN
  Administered 2015-01-03: 1 mg via ORAL
  Filled 2015-01-02: qty 1

## 2015-01-02 MED ORDER — DULOXETINE HCL 30 MG PO CPEP
30.0000 mg | ORAL_CAPSULE | Freq: Every day | ORAL | Status: DC
  Administered 2015-01-02 – 2015-01-03 (×2): 30 mg via ORAL
  Filled 2015-01-02 (×2): qty 1

## 2015-01-02 NOTE — ED Notes (Signed)
This RN called [facility] to determine when telepsych will be initiated. Aurther Lofterry states she will call shortly. Pt updated.

## 2015-01-02 NOTE — ED Notes (Signed)
Pt requesting pain medication for her foot. Informed her ibuprofen is ordered but pt states the ibuprofen does not help. Informed her that nothing stronger is ordered.

## 2015-01-02 NOTE — ED Notes (Signed)
This RN spoke to Dr. Juleen ChinaKohut via telephone informing him that patient is requesting a nicotine patch and methadone. He states he will look through her chart. No new orders at this time.
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
## 2015-01-02 NOTE — ED Notes (Signed)
Pt had a violent outburst and slung the bedside table at staff and began screaming profanity at staff. Security and [employer] called and they responded.

## 2015-01-02 NOTE — [facility] Counselor (Signed)
Nurse reports that she will have the consult taken out because the patient is not read  to be assessed.

## 2015-01-02 NOTE — ED Notes (Signed)
Telepsych in process 

## 2015-01-02 NOTE — ED Notes (Signed)
Pt here for right foot pain, injury and thoughts of suicide.

## 2015-01-02 NOTE — ED Notes (Signed)
Pt given post op shoe by Janene MadeiraNicole S, RN; left foot elevated.

## 2015-01-02 NOTE — ED Provider Notes (Signed)
CSN: [id number]     Arrival date & time 01/02/15  1311 History   First MD Initiated Contact with Patient 01/02/15 1452     Chief Complaint  Patient presents with  . Suicidal  . Foot Injury     (Consider location/radiation/quality/duration/timing/severity/associated sxs/prior Treatment) HPI  Pt presenting with c/o suicidal thoughts.  She also c/o left foot pain.  She states she stepped off a curb this morning and had acute onset of pain in foot.  Has been able to bear weight on foot.  Has taken goody powders without much relief.  Pt states she had been having worsening depression- she told triage nurse that she has thought of committing suicide.  She has sent text messages to her boyfriend threatening to kill herself as well.  She states she has been taking her medications regularly.  She states she does not feel the buspar helps her symptoms.  She stets she is on methadone and denies taking any illicit substances for the past 3 years.  There are no other associated systemic symptoms, there are no other alleviating or modifying factors.   Past Medical History  Diagnosis Date  . Depression    Past Surgical History  Procedure Laterality Date  . Cesarean section    . Tonsillectomy     History reviewed. No pertinent family history. History  Substance Use Topics  . Smoking status: Current Every Day Smoker -- 1.00 packs/day    Types: Cigarettes  . Smokeless tobacco: Not on file  . Alcohol Use: Yes     Comment: occassionally   OB History    No data available     Review of Systems  ROS reviewed and all otherwise negative except for mentioned in HPI    Allergies  Review of patient's allergies indicates no known allergies.  Home Medications   Prior to Admission medications   Medication Sig Start Date End Date Taking? Authorizing Provider  BRINTELLIX 5 MG TABS Take 5 mg by mouth daily. 12/27/14  Yes Historical Provider, MD  buPROPion (WELLBUTRIN SR) 150 MG 12 hr tablet Take 150 mg  by mouth 2 (two) times daily. 09/21/14  Yes Historical Provider, MD  busPIRone (BUSPAR) 30 MG tablet Take 30 mg by mouth 2 (two) times daily. 12/26/14  Yes Historical Provider, MD  DULoxetine (CYMBALTA) 30 MG capsule Take 30 mg by mouth daily.   Yes Historical Provider, MD  gabapentin (NEURONTIN) 300 MG capsule Take 600 mg by mouth 2 (two) times daily. 12/26/14  Yes Historical Provider, MD  lamoTRIgine (LAMICTAL) 25 MG tablet Take 25 mg by mouth at bedtime.    Yes Historical Provider, MD  methadone (DOLOPHINE) 10 MG/ML solution Take 65 mg by mouth daily.   Yes Historical Provider, MD  methocarbamol (ROBAXIN) 500 MG tablet Take 1 tablet (500 mg total) by mouth 2 (two) times daily. 12/08/13  Yes Antony MaduraKelly Humes, PA-C  naproxen (NAPROSYN) 500 MG tablet Take 1 tablet (500 mg total) by mouth 2 (two) times daily. 12/08/13  Yes Antony MaduraKelly Humes, PA-C  prazosin (MINIPRESS) 1 MG capsule Take 1 mg by mouth daily. 12/26/14  Yes Historical Provider, MD  HYDROcodone-acetaminophen (NORCO/VICODIN) 5-325 MG per tablet Take 1-2 pills every 4-6 hours as needed for pain. Patient not taking: Reported on 01/02/2015 09/10/13   Junius FinnerErin O'Malley, PA-C  sulfamethoxazole-trimethoprim (SEPTRA DS) 800-160 MG per tablet Take 1 tablet by mouth every 12 (twelve) hours. Patient not taking: Reported on 01/02/2015 09/10/13   Junius FinnerErin O'Malley, PA-C   BP 117/81 mmHg  Pulse 89  Temp(Src) 98 F (36.7 C) (Oral)  Resp 16  SpO2 96%  LMP 12/31/2014  Vitals reviewed Physical Exam  Physical Examination: General appearance - alert, disheveled and anxious appearing, and in no distress Mental status - alert, oriented to person, place, and time Eyes - no conjunctival injection, no scleral icterus Mouth - mucous membranes moist, pharynx normal without lesions Chest - clear to auscultation, no wheezes, rales or rhonchi, symmetric air entry Heart - normal rate, regular rhythm, normal S1, S2, no murmurs, rubs, clicks or gallops Neurological - alert, oriented, normal  speech, no focal findings or movement disorder noted Musculoskeletal - ttp diffusely over dorsum of left foot- no contusion 2+ dp pulse, distally NVI otherwise no joint tenderness, deformity or swelling Extremities - peripheral pulses normal, no pedal edema, no clubbing or cyanosis Skin - normal coloration and turgor, no rashes Psych- tearful, intermittently cooperative  ED Course  Procedures (including critical care time) Labs Review Labs Reviewed  ACETAMINOPHEN LEVEL - Abnormal; Notable for the following:    Acetaminophen (Tylenol), Serum <10 (*)    All other components within normal limits  CBC - Abnormal; Notable for the following:    RDW 18.1 (*)    All other components within normal limits  COMPREHENSIVE METABOLIC PANEL - Abnormal; Notable for the following:    Calcium 8.8 (*)    Total Bilirubin 0.2 (*)    All other components within normal limits  ETHANOL  SALICYLATE LEVEL  URINE RAPID DRUG SCREEN (HOSP PERFORMED)  POC URINE PREG, ED    Imaging Review Dg Foot Complete Left  01/02/2015   CLINICAL DATA:  Fall today with foot pain, initial encounter  EXAM: LEFT FOOT - COMPLETE 3+ VIEW  COMPARISON:  None.  FINDINGS: There are changes consistent with prior healed fifth metatarsal fractured distally. No acute fracture or dislocation is noted. No soft tissue changes are seen.  IMPRESSION: Findings of prior trauma.  No acute abnormality noted.   Electronically Signed   By: Alcide CleverMark  Lukens M.D.   On: 01/02/2015 14:32     EKG Interpretation None      MDM   Final diagnoses:  Foot pain, left  Suicidal ideation    Pt presenting with left foot pain after stepping off a curb this morning.  Xray reassuring.   Xray images reviewed and interpreted by me as well.  Pt is tearful and has threatened suicide- states she feels like 'a [place] pig' and that her psychiatrist cannot find the right meds that help her symptoms.  She states that xanax with cymbalta has helped in the past but that the  doctor is not willing to prescribe the xanax to her.  Pt given ice pack for her foot- at time of discharge she can have a post op shoe.  IVC papers filled out by me, psych holding orders written.  TTS consulted.       Jerelyn ScottMartha Linker, MD 01/03/15 [phone]

## 2015-01-03 ENCOUNTER — Inpatient Hospital Stay (HOSPITAL_COMMUNITY)
Admission: AD | Admit: 2015-01-03 | Discharge: 2015-01-07 | DRG: 897 | Disposition: A | Discharge status: Home / Self Care | Payer: MEDICAID | Source: Intra-hospital | Attending: Psychiatry | Admitting: Psychiatry

## 2015-01-03 ENCOUNTER — Encounter (HOSPITAL_COMMUNITY): Payer: Self-pay | Admitting: Behavioral Health

## 2015-01-03 DIAGNOSIS — F4310 Post-traumatic stress disorder, unspecified: Secondary | ICD-10-CM | POA: Diagnosis present

## 2015-01-03 DIAGNOSIS — S99922A Unspecified injury of left foot, initial encounter: Secondary | ICD-10-CM | POA: Diagnosis not present

## 2015-01-03 DIAGNOSIS — F1310 Sedative, hypnotic or anxiolytic abuse, uncomplicated: Secondary | ICD-10-CM | POA: Diagnosis not present

## 2015-01-03 DIAGNOSIS — Z79899 Other long term (current) drug therapy: Secondary | ICD-10-CM | POA: Diagnosis not present

## 2015-01-03 DIAGNOSIS — F1920 Other psychoactive substance dependence, uncomplicated: Secondary | ICD-10-CM | POA: Diagnosis not present

## 2015-01-03 DIAGNOSIS — F17210 Nicotine dependence, cigarettes, uncomplicated: Secondary | ICD-10-CM | POA: Diagnosis present

## 2015-01-03 DIAGNOSIS — G4700 Insomnia, unspecified: Secondary | ICD-10-CM | POA: Diagnosis present

## 2015-01-03 DIAGNOSIS — F1910 Other psychoactive substance abuse, uncomplicated: Secondary | ICD-10-CM | POA: Diagnosis present

## 2015-01-03 DIAGNOSIS — F1120 Opioid dependence, uncomplicated: Secondary | ICD-10-CM | POA: Diagnosis present

## 2015-01-03 DIAGNOSIS — F319 Bipolar disorder, unspecified: Secondary | ICD-10-CM | POA: Diagnosis present

## 2015-01-03 MED ORDER — NICOTINE 21 MG/24HR TD PT24
21.0000 mg | MEDICATED_PATCH | Freq: Once | TRANSDERMAL | Status: DC
  Administered 2015-01-03: 21 mg via TRANSDERMAL
  Filled 2015-01-03: qty 1

## 2015-01-03 MED ORDER — CHLORDIAZEPOXIDE HCL 25 MG PO CAPS
25.0000 mg | ORAL_CAPSULE | Freq: Four times a day (QID) | ORAL | Status: AC | PRN
  Administered 2015-01-05 – 2015-01-06 (×2): 25 mg via ORAL
  Filled 2015-01-03 (×2): qty 1

## 2015-01-03 MED ORDER — CLONIDINE HCL 0.1 MG PO TABS
0.1000 mg | ORAL_TABLET | Freq: Four times a day (QID) | ORAL | Status: AC
  Administered 2015-01-03 – 2015-01-05 (×8): 0.1 mg via ORAL
  Filled 2015-01-03 (×14): qty 1

## 2015-01-03 MED ORDER — VITAMIN B-1 100 MG PO TABS
100.0000 mg | ORAL_TABLET | Freq: Every day | ORAL | Status: DC
  Administered 2015-01-04 – 2015-01-07 (×4): 100 mg via ORAL
  Filled 2015-01-03 (×7): qty 1

## 2015-01-03 MED ORDER — LOPERAMIDE HCL 2 MG PO CAPS
2.0000 mg | ORAL_CAPSULE | ORAL | Status: DC | PRN
  Administered 2015-01-07: 2 mg via ORAL
  Filled 2015-01-03: qty 1

## 2015-01-03 MED ORDER — THIAMINE HCL 100 MG/ML IJ SOLN
100.0000 mg | Freq: Once | INTRAMUSCULAR | Status: AC
  Administered 2015-01-03: 100 mg via INTRAMUSCULAR
  Filled 2015-01-03: qty 2

## 2015-01-03 MED ORDER — NICOTINE POLACRILEX 2 MG MT GUM
2.0000 mg | CHEWING_GUM | OROMUCOSAL | Status: DC | PRN
  Administered 2015-01-03: 2 mg via ORAL
  Administered 2015-01-03: 16:00:00 via ORAL
  Administered 2015-01-04 – 2015-01-07 (×9): 2 mg via ORAL
  Filled 2015-01-03 (×6): qty 1

## 2015-01-03 MED ORDER — CHLORDIAZEPOXIDE HCL 25 MG PO CAPS
25.0000 mg | ORAL_CAPSULE | Freq: Every day | ORAL | Status: AC
  Administered 2015-01-07: 25 mg via ORAL
  Filled 2015-01-03: qty 1

## 2015-01-03 MED ORDER — CHLORDIAZEPOXIDE HCL 25 MG PO CAPS
25.0000 mg | ORAL_CAPSULE | Freq: Three times a day (TID) | ORAL | Status: AC
  Administered 2015-01-05: 25 mg via ORAL
  Filled 2015-01-03 (×2): qty 1

## 2015-01-03 MED ORDER — MAGNESIUM HYDROXIDE 400 MG/5ML PO SUSP
30.0000 mL | Freq: Every day | ORAL | Status: DC | PRN
  Administered 2015-01-04: 30 mL via ORAL
  Filled 2015-01-03: qty 30

## 2015-01-03 MED ORDER — ONDANSETRON 4 MG PO TBDP: 4.0000 mg | ORAL_TABLET | Freq: Four times a day (QID) | ORAL | Status: DC | PRN

## 2015-01-03 MED ORDER — METHOCARBAMOL 500 MG PO TABS
500.0000 mg | ORAL_TABLET | Freq: Three times a day (TID) | ORAL | Status: DC | PRN
  Administered 2015-01-03 – 2015-01-04 (×3): 500 mg via ORAL
  Filled 2015-01-03 (×3): qty 1

## 2015-01-03 MED ORDER — CHLORDIAZEPOXIDE HCL 25 MG PO CAPS
25.0000 mg | ORAL_CAPSULE | ORAL | Status: AC
  Administered 2015-01-06 (×2): 25 mg via ORAL
  Filled 2015-01-03 (×2): qty 1

## 2015-01-03 MED ORDER — CLONIDINE HCL 0.1 MG PO TABS
0.1000 mg | ORAL_TABLET | ORAL | Status: DC
  Administered 2015-01-06 – 2015-01-07 (×3): 0.1 mg via ORAL
  Filled 2015-01-03 (×4): qty 1

## 2015-01-03 MED ORDER — NAPROXEN 500 MG PO TABS
500.0000 mg | ORAL_TABLET | Freq: Two times a day (BID) | ORAL | Status: DC | PRN
  Administered 2015-01-04 – 2015-01-06 (×3): 500 mg via ORAL
  Filled 2015-01-03 (×3): qty 1

## 2015-01-03 MED ORDER — DICYCLOMINE HCL 20 MG PO TABS
20.0000 mg | ORAL_TABLET | Freq: Four times a day (QID) | ORAL | Status: DC | PRN
  Administered 2015-01-04: 20 mg via ORAL
  Filled 2015-01-03: qty 1

## 2015-01-03 MED ORDER — NICOTINE POLACRILEX 2 MG MT GUM
CHEWING_GUM | OROMUCOSAL | Status: AC
  Filled 2015-01-03: qty 1

## 2015-01-03 MED ORDER — LOPERAMIDE HCL 2 MG PO CAPS: 2.0000 mg | ORAL_CAPSULE | ORAL | Status: DC | PRN

## 2015-01-03 MED ORDER — CHLORDIAZEPOXIDE HCL 25 MG PO CAPS
25.0000 mg | ORAL_CAPSULE | Freq: Four times a day (QID) | ORAL | Status: AC
  Administered 2015-01-03 – 2015-01-04 (×5): 25 mg via ORAL
  Filled 2015-01-03 (×6): qty 1

## 2015-01-03 MED ORDER — CLONIDINE HCL 0.1 MG PO TABS
0.1000 mg | ORAL_TABLET | Freq: Every day | ORAL | Status: DC
  Filled 2015-01-03 (×2): qty 1

## 2015-01-03 MED ORDER — ACETAMINOPHEN 325 MG PO TABS
650.0000 mg | ORAL_TABLET | Freq: Four times a day (QID) | ORAL | Status: DC | PRN
  Administered 2015-01-05 – 2015-01-07 (×4): 650 mg via ORAL
  Filled 2015-01-03 (×5): qty 2

## 2015-01-03 MED ORDER — ALUM & MAG HYDROXIDE-SIMETH 200-200-20 MG/5ML PO SUSP: 30.0000 mL | ORAL | Status: DC | PRN

## 2015-01-03 MED ORDER — ADULT MULTIVITAMIN W/MINERALS CH
1.0000 | ORAL_TABLET | Freq: Every day | ORAL | Status: DC
  Administered 2015-01-03 – 2015-01-07 (×5): 1 via ORAL
  Filled 2015-01-03 (×9): qty 1

## 2015-01-03 MED ORDER — HYDROXYZINE HCL 25 MG PO TABS
25.0000 mg | ORAL_TABLET | Freq: Four times a day (QID) | ORAL | Status: DC | PRN
  Administered 2015-01-03 – 2015-01-04 (×4): 25 mg via ORAL
  Filled 2015-01-03 (×4): qty 1

## 2015-01-03 NOTE — Progress Notes (Signed)
34 year old female admitted for SI with no plan and polysubstance abuse. Pt requesting detox from methadone. She takes 65mg  of methadone but hasn't taken it in a couple days. She has a history of PTSD, Bipolar d/o, and ADHD. She denies any medical history. She feels like the medication prescribed by her psychiatrist (Brintellix) increased her suicidal thoughts. She denies SI/HI/AVH. Consents signed and pt verbalized understanding. Skin visually assessed and belongings searched. Pt oriented to unit. No complaints of pain or discomfort at this time. Q15 min checks maintained. Will continue to monitor pt.

## 2015-01-03 NOTE — Tx Team (Signed)
Initial Interdisciplinary Treatment Plan   PATIENT STRESSORS: Financial difficulties Medication change or noncompliance Substance abuse   PATIENT STRENGTHS: Average or above average intelligence Communication skills General fund of knowledge Physical Health Supportive family/friends   PROBLEM LIST: Problem List/Patient Goals Date to be addressed Date deferred Reason deferred Estimated date of resolution  Substance abuse 01/03/15     Suicidal Ideation  01/03/15     Depression 01/03/15     "I want to get off methadone" 01/03/15                                    DISCHARGE CRITERIA:  Improved stabilization in mood, thinking, and/or behavior Verbal commitment to aftercare and medication compliance Withdrawal symptoms are absent or subacute and managed without 24-hour nursing intervention  PRELIMINARY DISCHARGE PLAN: Attend 12-step recovery group Outpatient therapy Return to previous living arrangement Return to previous work or school arrangements  PATIENT/FAMIILY INVOLVEMENT: This treatment plan has been presented to and reviewed with the patient, Lynn Marshall, and/or family member.  The patient and family have been given the opportunity to ask questions and make suggestions.  [name], Janise Gora T 01/03/2015, 4:14 PM

## 2015-01-03 NOTE — Progress Notes (Signed)
Patient did attend the evening karaoke group. Pt was engaged, supportive, and participated by singing a song.  

## 2015-01-03 NOTE — BH Assessment (Signed)
Inpt MH/SA recommended. [facility] current [facility]. TTS seeking placement.  Ref[place] to: [place] [place], [place], [place], [place] [address], [facility]  Tavie Haseman, [place] Triage Specialist 01/03/2015 12:08 AM

## 2015-01-03 NOTE — Progress Notes (Signed)
Per Tanna SavoyEric, AC, [facility] by Dr. Dub MikesLugo. Can be transported when ready.  Spoke with [facility] RN regarding pt's placement.  Ilean SkillMeghan Shyler Holzman, MSW, LCSWA Clinical Social Work, Disposition  01/03/2015 [phone]

## 2015-01-04 DIAGNOSIS — F1310 Sedative, hypnotic or anxiolytic abuse, uncomplicated: Secondary | ICD-10-CM

## 2015-01-04 DIAGNOSIS — F4310 Post-traumatic stress disorder, unspecified: Secondary | ICD-10-CM

## 2015-01-04 DIAGNOSIS — F1120 Opioid dependence, uncomplicated: Principal | ICD-10-CM

## 2015-01-04 DIAGNOSIS — F1920 Other psychoactive substance dependence, uncomplicated: Secondary | ICD-10-CM | POA: Diagnosis present

## 2015-01-04 NOTE — Progress Notes (Signed)
D    Pt is anxious and sad   She talked about her medications and how they made her feel   She also complained of having chronic bronchitis and asked for a breathing treatment but did not know what she should take   She interacts well with others and attended this evenings AA group and was appropriate    A    Verbal support given   Medications administered and effectiveness monitored   Q 15 min checks R   Pt safe at present

## 2015-01-04 NOTE — Progress Notes (Signed)
Pt attended AA group and participated in group appropriately

## 2015-01-04 NOTE — Progress Notes (Signed)
D    Pt is anxious and sad   She talked about her medications and how they made her feel   She also complained of having chronic bronchitis and asked for a breathing treatment but did not know what she should take   She interacts well with others and attended this evenings karaoke group A    Verbal support given   Medications administered and effectiveness monitored   Q 15 min checks R   Pt safe at present

## 2015-01-04 NOTE — Progress Notes (Signed)
Recreation Therapy Notes  Date: 01/04/15 Time: 9:30am Location: 300 Hall Group Room  Group Topic: Stress Management  Goal Area(s) Addresses:  Patient will actively participate in stress management techniques presented during session.   Intervention: Stress management techniques  Activity: Guided Imagery. LRT provided instruction and demonstration for Guided Imagery.   Education: Stress Management, Discharge Planning.   Clinical Observations/Feedback: Patient did not attend group.   Phallon Haydu, LRT/CTRS         Aisha Greenberger A 01/04/2015 3:42 PM 

## 2015-01-04 NOTE — [facility] Group Notes (Signed)
[facility] LCSW Group Therapy  01/04/2015 1:18 PM  Type of Therapy:  Group Therapy  Participation Level:  Active  Participation Quality:  Attentive  Affect:  Appropriate  Cognitive:  Lacking  Insight:  Limited  Engagement in Therapy:  Limited  Modes of Intervention:  Confrontation, Discussion, Education, Exploration, Problem-solving, Rapport Building, Socialization and Support  Summary of Progress/Problems: Feelings around Relapse. Group members discussed the meaning of relapse and shared personal stories of relapse, how it affected them and others, and how they perceived themselves during this time. Group members were encouraged to identify triggers, warning signs and coping skills used when facing the possibility of relapse. Social supports were discussed and explored in detail. Post Acute Withdrawal Syndrome (handout provided) was introduced and examined. Pt's were encouraged to ask questions, talk about key points associated with PAWS, and process this information in terms of relapse prevention. Annice PihJackie was attentive and engaged during today's processing group. She talked about her latest relapse and how she could no longer afford the methadone she had been prescribed for the past few years. "I relapsed on heroin." Pt states that she does not believe marijuana is a harmful drug and does not plan to stop smoking marijuana, but is hoping to maintain sobriety from benzos and heroin. She shared that she would like NAMI information for her s/o "so that he can understand me better and what I'm going through."   Smart, [place] LCSWA 01/04/2015, 1:18 PM

## 2015-01-04 NOTE — [facility] Suicide Risk Assessment (Signed)
[facility] INPATIENT:  Family/Significant Other Suicide Prevention Education  Suicide Prevention Education:  Contact Attempts: Maurilio LovelyJason Boone (pt's boyfriend) [phone] has been identified by the patient as the family member/significant other with whom the patient will be residing, and identified as the person(s) who will aid the patient in the event of a mental health crisis.  With written consent from the patient, two attempts were made to provide suicide prevention education, prior to and/or following the patient's discharge.  We were unsuccessful in providing suicide prevention education.  A suicide education pamphlet was given to the patient to share with family/significant other.  Date and time of first attempt: 01/04/15 at 3:35PM (unable to leave voicemail)  Date and time of second attempt:  01/06/15 at 9:54AM (unable to leave voicemail) - this attempt by Ambrose MantleMareida Grossman-Orr, LCSW  Smart, Heather LCSWA  01/04/2015, 3:25 PM

## 2015-01-04 NOTE — [facility] Group Notes (Signed)
[facility]   01/04/2015 9:38 AM  Participation Quality:  Appropriate   Mood/Affect:  Appropriate  Depression Rating:  0  Anxiety Rating: 0    Thoughts of Suicide:  No Will you contract for safety?   NA  Current AVH:  No  Plan for Discharge/Comments: Lynn Marshall stated that she wants to d/c asap and [place] and plans [facility]. Pt refusing all other referrals and is vague about her housing information. Pt reports that she struggles with PTSD-"mostly reoccuring dreams" and was recently put on Brillotex-"it messed me up really bad. I'm here to get that straightened out."    Transportation Means: unknown at this time.   Supports: none identified by pt.   Smart, American FinancialHeather LCSWA
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
## 2015-01-04 NOTE — Progress Notes (Signed)
D: Patient is A&Ox4, denies SI/HI and A/V hallucinations.  Patient states she feels very depressed and would like to adjust her medications because she states that what she is taking at home is not working and making her worse. Patient stated this morning she felt the best she has in awhile, however, during the afternoon patient stated she really feels bad, high anxiety, and feeling like she is withdrawing. Patient rates her depression as 3/6, hopelessness 6/10, and anxiety 10/10. Patient attending groups, but then slept the rest of the afternoon. A: Patient is receiving medications as scheduled and prn. Patient given emotional support as needed. Continue monitoring patient q15 minutes for safety. R: patient remains safe. Patient verbalizes intent to find staff if she can no longer contract for safety.  Lynn Marshall, [name], RN
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
## 2015-01-05 DIAGNOSIS — F1920 Other psychoactive substance dependence, uncomplicated: Secondary | ICD-10-CM

## 2015-01-05 MED ORDER — DULOXETINE HCL 30 MG PO CPEP
30.0000 mg | ORAL_CAPSULE | Freq: Every day | ORAL | Status: DC
Start: 2015-01-05 — End: 2015-01-05

## 2015-01-05 MED ORDER — HYDROXYZINE HCL 50 MG PO TABS
50.0000 mg | ORAL_TABLET | Freq: Four times a day (QID) | ORAL | Status: DC | PRN
  Administered 2015-01-05 – 2015-01-07 (×5): 50 mg via ORAL
  Filled 2015-01-05: qty 1
  Filled 2015-01-05: qty 20
  Filled 2015-01-05 (×3): qty 1

## 2015-01-05 MED ORDER — MIRTAZAPINE 7.5 MG PO TABS
7.5000 mg | ORAL_TABLET | Freq: Every day | ORAL | Status: DC
  Administered 2015-01-05 – 2015-01-06 (×2): 7.5 mg via ORAL
  Filled 2015-01-05: qty 14
  Filled 2015-01-05 (×3): qty 1

## 2015-01-05 MED ORDER — HYDROXYZINE HCL 50 MG PO TABS
ORAL_TABLET | ORAL | Status: AC
  Filled 2015-01-05: qty 1

## 2015-01-05 MED ORDER — METHOCARBAMOL 750 MG PO TABS
ORAL_TABLET | ORAL | Status: AC
  Filled 2015-01-05: qty 1

## 2015-01-05 MED ORDER — METHOCARBAMOL 750 MG PO TABS
750.0000 mg | ORAL_TABLET | Freq: Three times a day (TID) | ORAL | Status: DC | PRN
  Administered 2015-01-05 – 2015-01-07 (×3): 750 mg via ORAL
  Filled 2015-01-05: qty 1

## 2015-01-05 NOTE — Progress Notes (Signed)
[facility]  01/05/2015 11:28 AM Lynn Marshall  MRN:  [id number] Subjective:  Lynn Marshall was seen today.  She is calm but anxious.  She states that she made a mistake and she bought street drugs when she had been "clean" for three years.  Her UDS was positive for cocaine, opioids and benzos.  She had been on several anti depressants and mood stabilizers in the past.  She is preoccupied with her anxiety.   States that she has been diagnosed with ADHD Bipolar PTSD.  Furthermore, she had an increase in suicidal ideation when she started taking Duloxetine.   States she is going to the methadone clinic ( [facility]). States she was taking 60 mg when she stopped going. States she used Heroin for 2 years before going to the methadone clinic. Sept 4 states she had been clean 3 years she relapsed states after she took the Brintelix.   Principal Problem: PTSD (post-traumatic stress disorder) Diagnosis:   Patient Active Problem List   Diagnosis Date Noted  . Opioid type dependence, continuous [F11.20] 01/04/2015  . Polysubstance dependence including opioid drug with daily use [F19.20] 01/04/2015  . PTSD (post-traumatic stress disorder) [F43.10] 01/04/2015  . Polysubstance abuse [F19.10] 01/03/2015   Total Time spent with patient: 30 minutes   Past Medical History:  Past Medical History  Diagnosis Date  . Depression   . Anxiety     Past Surgical History  Procedure Laterality Date  . Cesarean section    . Tonsillectomy     Family History: History reviewed. No pertinent family history. Social History:  History  Alcohol Use  . Yes    Comment: occassionally     History  Drug Use  . Yes  . Special: IV, Marijuana, Cocaine    Comment: heroin; uses 65mg  of methadone     History   Social History  . Marital Status: Single    Spouse Name: N/A  . Number of Children: N/A  . Years of Education: N/A   Social History Main Topics  . Smoking status: Current Every Day Smoker --  1.00 packs/day    Types: Cigarettes  . Smokeless tobacco: Not on file  . Alcohol Use: Yes     Comment: occassionally  . Drug Use: Yes    Special: IV, Marijuana, Cocaine     Comment: heroin; uses 65mg  of methadone   . Sexual Activity: Not on file   Other Topics Concern  . None   Social History Narrative   Additional History:    Sleep: Fair  Appetite:  Fair   Assessment:   Musculoskeletal: Strength & Muscle Tone: within normal limits Gait & Station: normal Patient leans: N/A   Psychiatric Specialty Exam: Physical Exam  ROS  Blood pressure 99/63, pulse 48, temperature 98 F (36.7 C), temperature source Oral, resp. rate 20, height 5' 4.5" (1.638 m), weight 89.812 kg (198 lb), last menstrual period 12/31/2014, SpO2 97 %.Body mass index is 33.47 kg/(m^2).   General Appearance: Fairly Groomed  Patent attorneyye Contact:: Fair  Speech: Clear and Coherent  Volume: Normal  Mood: Anxious and worried  Affect: anxious worried  Thought Process: Coherent and Goal Directed  Orientation: Full (Time, Place, and Person)  Thought Content: symptoms events worries concerns  Suicidal Thoughts: No  Homicidal Thoughts: No  Memory: Immediate; Fair Recent; Fair Remote; Fair  Judgement: Fair  Insight: Present  Psychomotor Activity: Restlessness  Concentration: Fair  Recall: FiservFair  Fund of Knowledge:Fair  Language: Fair  Akathisia: No  Handed:  Right  AIMS (if indicated):    Assets: Desire for Improvement Housing  ADL's: Intact  Cognition: WNL  Sleep: Number of Hours: 5.75       Current Medications: Current Facility-Administered Medications  Medication Dose Route Frequency Provider Last Rate Last Dose  . acetaminophen (TYLENOL) tablet 650 mg  650 mg Oral Q6H PRN Adonis Brook, NP      . alum & mag hydroxide-simeth (MAALOX/MYLANTA) 200-200-20 MG/5ML suspension 30 mL  30 mL Oral Q4H PRN Adonis Brook, NP      . chlordiazePOXIDE  (LIBRIUM) capsule 25 mg  25 mg Oral Q6H PRN Adonis Brook, NP      . chlordiazePOXIDE (LIBRIUM) capsule 25 mg  25 mg Oral TID Adonis Brook, NP   25 mg at 01/05/15 0814   Followed by  . [START ON 01/06/2015] chlordiazePOXIDE (LIBRIUM) capsule 25 mg  25 mg Oral BH-qamhs Adonis Brook, NP       Followed by  . [START ON 01/07/2015] chlordiazePOXIDE (LIBRIUM) capsule 25 mg  25 mg Oral Daily Adonis Brook, NP      . cloNIDine (CATAPRES) tablet 0.1 mg  0.1 mg Oral QID Adonis Brook, NP   0.1 mg at 01/05/15 1610   Followed by  . [START ON 01/06/2015] cloNIDine (CATAPRES) tablet 0.1 mg  0.1 mg Oral BH-qamhs Adonis Brook, NP       Followed by  . [START ON 01/08/2015] cloNIDine (CATAPRES) tablet 0.1 mg  0.1 mg Oral QAC breakfast Adonis Brook, NP      . dicyclomine (BENTYL) tablet 20 mg  20 mg Oral Q6H PRN Adonis Brook, NP   20 mg at 01/04/15 1802  . hydrOXYzine (ATARAX/VISTARIL) tablet 25 mg  25 mg Oral Q6H PRN Adonis Brook, NP   25 mg at 01/04/15 2125  . loperamide (IMODIUM) capsule 2-4 mg  2-4 mg Oral PRN Adonis Brook, NP      . loperamide (IMODIUM) capsule 2-4 mg  2-4 mg Oral PRN Adonis Brook, NP      . magnesium hydroxide (MILK OF MAGNESIA) suspension 30 mL  30 mL Oral Daily PRN Adonis Brook, NP   30 mL at 01/04/15 0950  . methocarbamol (ROBAXIN) tablet 500 mg  500 mg Oral Q8H PRN Adonis Brook, NP   500 mg at 01/04/15 2125  . multivitamin with minerals tablet 1 tablet  1 tablet Oral Daily Adonis Brook, NP   1 tablet at 01/05/15 9604  . naproxen (NAPROSYN) tablet 500 mg  500 mg Oral BID PRN Adonis Brook, NP   500 mg at 01/04/15 1802  . nicotine polacrilex (NICORETTE) gum 2 mg  2 mg Oral PRN Adonis Brook, NP   2 mg at 01/05/15 0817  . ondansetron (ZOFRAN-ODT) disintegrating tablet 4 mg  4 mg Oral Q6H PRN Adonis Brook, NP      . thiamine (VITAMIN B-1) tablet 100 mg  100 mg Oral Daily Adonis Brook, NP   100 mg at 01/05/15 5409    Lab Results: No results found for this or any  previous visit (from the past 48 hour(s)).  Physical Findings: AIMS: Facial and Oral Movements Muscles of Facial Expression: None, normal Lips and Perioral Area: None, normal Jaw: None, normal Tongue: None, normal,Extremity Movements Upper (arms, wrists, hands, fingers): None, normal Lower (legs, knees, ankles, toes): None, normal, Trunk Movements Neck, shoulders, hips: None, normal, Overall Severity Severity of abnormal movements (highest score from questions above): None, normal Incapacitation due to abnormal movements: None, normal Patient's awareness of abnormal movements (  rate only patient's report): No Awareness, Dental Status Current problems with teeth and/or dentures?: No Does patient usually wear dentures?: No  CIWA:  CIWA-Ar Total: 6 COWS:  COWS Total Score: 4  Treatment Plan Summary: Daily contact with patient to assess and evaluate symptoms and progress in treatment and Medication management Supportive approach/coping skills Opioid dependence; will address the withdrawal symptoms as they develop Benzodiazepine Abuse; 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  Insomnia added Remeron 15 mg. Robaxin 750 mg increased Vistaril increase to 50 mg Will work a relapse prevention plan Trauma; will help to start processing the traumatic events she has been trough  PTSD; will reassess for the use of psychotropics as indicated Will work with CBT/mindfulness Will explore the need for a residential treatment program  Medical Decision Making:  Review of Psycho-Social Stressors (1), Discuss test with performing physician (1), Decision to obtain old records (1), Review of Medication Regimen & Side Effects (2) and Review of New Medication or Change in Dosage (2)  Velna HatchetSheila May Agustin AGNP-BC 01/05/2015, 11:28 AM I agree with findings and treatment plan of this patient

## 2015-01-05 NOTE — [facility] Group Notes (Signed)
[facility] Group Notes:  (Clinical Social Work)  01/05/2015     10-11AM  Summary of Progress/Problems:   The main focus of today's process group was to learn how to use a decisional balance exercise to move forward in the Stages of Change, which were described and discussed.  Motivational Interviewing and a worksheet were utilized to help patients explore in depth the perceived benefits and costs of unhealthy coping techniques, as well as the  benefits and costs of replacing that with a healthy coping skills.   The patient expressed that their unhealthy coping involves denying her feelings, suicidal ideation, using drugs and isolation.  She was agitated several times during group, and eventually left group stating she could not handle it any longer.  While there, she attempted to participate and was a good contributor to group.  Type of Therapy:  Group Therapy - Process   Participation Level:  Active  Participation Quality:  Attentive and Sharing  Affect:  Blunted and Irritable  Cognitive:  Alert  Insight:  Developing/Improving  Engagement in Therapy:  Developing/Improving  Modes of Intervention:  Education, Motivational Interviewing  Ambrose MantleMareida Grossman-Orr, LCSW 01/05/2015, 12:36 PM

## 2015-01-05 NOTE — Progress Notes (Signed)
D: Pt's mood is depressed. Eye contact is fair. Pt rates her depression 8/10, and hopelessness 7/10. Pt states that she had bad nightmares last night. Denies SI/HI/AVH.  A: Support given. Verbalization encouraged. Pt encouraged to come to staff for any concerns. Medications given as prescribed. R: Pt is receptive. No complaints of pain or discomfort at this time. Q15 min safety checks maintained. Pt remains safe on the unit. Will continue to monitor.

## 2015-01-05 NOTE — [facility] Group Notes (Addendum)
[facility] Group Notes:  (Nursing/MHT/Case Management/Adjunct)  Date:  01/05/2015  Time:  11:47 AM  Type of Therapy:  Psychoeducational Skills  Participation Level:  Active  Participation Quality:  Attentive  Affect:  Appropriate  Cognitive:  Appropriate  Insight:  Improving  Engagement in Group:  Engaged  Modes of Intervention:  Discussion, Education and Support  Summary of Progress/Problems: Pt stated that her goal today was to try to be more inspirational. She also states that one of her coping skills is gardening and walking.   [name], [name] 01/05/2015, 11:47 AM

## 2015-01-06 NOTE — Progress Notes (Signed)
Patient ID: Lynn Marshall, female   DOB: 04/08/1981, 34 y.o.   MRN: [id number] D: Took over patient's care @ 2300. Patient in bed sleeping. Respiration regular and unlabored. No sign of distress noted at this time A: 15 mins checks for safety. R: Patient is safe.

## 2015-01-06 NOTE — Progress Notes (Signed)
Patient ID: Annia FriendlyJacqueline Costantino, female   DOB: 04/27/1981, 34 y.o.   MRN: [id number] [facility]  01/06/2015 3:40 PM Annia FriendlyJacqueline Schutter  MRN:  [id number] Subjective:  Patient states "I am doing much better. My entire goal is to be off drugs and methadone. I have lost two important people in my life to heroin overdose. I have three children to take care of. I feel guilty about my relapse but I am in a really good place right now. I have no desire to do drugs at all."  Objective:   Annia FriendlyJacqueline Langwell is a 34 year old female who reported an increase in suicidal ideation when she started taking Duloxetine.  States she is going to the methadone clinic ( [facility]). States she was taking 60 mg when she stopped going. States she used Heroin for 2 years before going to the methadone clinic. Sept 4 states she had been clean 3 years she relapsed states after she took the Brintelix. Patient is active on the unit and attending groups. Is tolerating her regimen well with no reported side effects. Has completed the Clonidine protocol for opiate detox. Appears to be highly motivated to stay sober. Reports telling her outpatient Provider that she was having problems on the antidepressant medications but no changes were made. Reports hoping to discharge soon to get back to her family and reports having a very good support system.   Principal Problem: PTSD (post-traumatic stress disorder) Diagnosis:   Patient Active Problem List   Diagnosis Date Noted  . Opioid type dependence, continuous [F11.20] 01/04/2015  . Polysubstance dependence including opioid drug with daily use [F19.20] 01/04/2015  . PTSD (post-traumatic stress disorder) [F43.10] 01/04/2015  . Polysubstance abuse [F19.10] 01/03/2015   Total Time spent with patient: 20 minutes  Past Medical History:  Past Medical History  Diagnosis Date  . Depression   . Anxiety     Past Surgical History  Procedure Laterality Date  . Cesarean section     . Tonsillectomy     Family History: History reviewed. No pertinent family history. Social History:  History  Alcohol Use  . Yes    Comment: occassionally     History  Drug Use  . Yes  . Special: IV, Marijuana, Cocaine    Comment: heroin; uses 65mg  of methadone     History   Social History  . Marital Status: Single    Spouse Name: N/A  . Number of Children: N/A  . Years of Education: N/A   Social History Main Topics  . Smoking status: Current Every Day Smoker -- 1.00 packs/day    Types: Cigarettes  . Smokeless tobacco: Not on file  . Alcohol Use: Yes     Comment: occassionally  . Drug Use: Yes    Special: IV, Marijuana, Cocaine     Comment: heroin; uses 65mg  of methadone   . Sexual Activity: Not on file   Other Topics Concern  . None   Social History Narrative   Additional History:    Sleep: Fair  Appetite:  Good  Assessment:   Musculoskeletal: Strength & Muscle Tone: within normal limits Gait & Station: normal Patient leans: N/A  Psychiatric Specialty Exam: Physical Exam  Review of Systems  Constitutional: Negative.   HENT: Negative.   Eyes: Negative.   Respiratory: Negative.   Cardiovascular: Negative.   Gastrointestinal: Negative.   Genitourinary: Negative.   Musculoskeletal: Negative.   Skin: Negative.   Neurological: Negative.   Endo/Heme/Allergies: Negative.   Psychiatric/Behavioral: Positive  for depression and substance abuse. Negative for suicidal ideas, hallucinations and memory loss. The patient is not nervous/anxious and does not have insomnia.     Blood pressure 102/63, pulse 52, temperature 98.6 F (37 C), temperature source Oral, resp. rate 16, height 5' 4.5" (1.638 m), weight 89.812 kg (198 lb), last menstrual period 12/31/2014, SpO2 97 %.Body mass index is 33.47 kg/(m^2).   General Appearance: Well groomed  Eye Contact:: Good  Speech: Clear and Coherent  Volume: Normal  Mood: Anxious   Affect: Bright  Thought  Process: Coherent and Goal Directed  Orientation: Full (Time, Place, and Person)  Thought Content: symptoms events worries concerns  Suicidal Thoughts: No  Homicidal Thoughts: No  Memory: Immediate; Good Recent; Fair Remote; Fair  Judgement: Fair  Insight: Present  Psychomotor Activity: Restlessness  Concentration: Good  Recall: Fair  Fund of Knowledge: Good  Language: Fair  Akathisia: No  Handed: Right  AIMS (if indicated):    Assets: Desire for Improvement Housing  ADL's: Intact  Cognition: WNL  Sleep: Number of Hours: 5.75       Current Medications: Current Facility-Administered Medications  Medication Dose Route Frequency Provider Last Rate Last Dose  . acetaminophen (TYLENOL) tablet 650 mg  650 mg Oral Q6H PRN Adonis BrookSheila Agustin, NP   650 mg at 01/06/15 0830  . alum & mag hydroxide-simeth (MAALOX/MYLANTA) 200-200-20 MG/5ML suspension 30 mL  30 mL Oral Q4H PRN Adonis BrookSheila Agustin, NP      . chlordiazePOXIDE (LIBRIUM) capsule 25 mg  25 mg Oral Q6H PRN Adonis BrookSheila Agustin, NP   25 mg at 01/06/15 1252  . chlordiazePOXIDE (LIBRIUM) capsule 25 mg  25 mg Oral BH-qamhs Adonis BrookSheila Agustin, NP   25 mg at 01/06/15 0816   Followed by  . [START ON 01/07/2015] chlordiazePOXIDE (LIBRIUM) capsule 25 mg  25 mg Oral Daily Adonis BrookSheila Agustin, NP      . cloNIDine (CATAPRES) tablet 0.1 mg  0.1 mg Oral BH-qamhs Adonis BrookSheila Agustin, NP   0.1 mg at 01/06/15 0830   Followed by  . [START ON 01/08/2015] cloNIDine (CATAPRES) tablet 0.1 mg  0.1 mg Oral QAC breakfast Adonis BrookSheila Agustin, NP      . dicyclomine (BENTYL) tablet 20 mg  20 mg Oral Q6H PRN Adonis BrookSheila Agustin, NP   20 mg at 01/04/15 1802  . hydrOXYzine (ATARAX/VISTARIL) tablet 50 mg  50 mg Oral Q6H PRN Adonis BrookSheila Agustin, NP   50 mg at 01/06/15 1117  . loperamide (IMODIUM) capsule 2-4 mg  2-4 mg Oral PRN Adonis BrookSheila Agustin, NP      . magnesium hydroxide (MILK OF MAGNESIA) suspension 30 mL  30 mL Oral Daily PRN Adonis BrookSheila Agustin, NP   30 mL at  01/04/15 0950  . methocarbamol (ROBAXIN) tablet 750 mg  750 mg Oral Q8H PRN Adonis BrookSheila Agustin, NP   750 mg at 01/05/15 1211  . mirtazapine (REMERON) tablet 7.5 mg  7.5 mg Oral QHS Adonis BrookSheila Agustin, NP   7.5 mg at 01/05/15 2117  . multivitamin with minerals tablet 1 tablet  1 tablet Oral Daily Adonis BrookSheila Agustin, NP   1 tablet at 01/06/15 0816  . naproxen (NAPROSYN) tablet 500 mg  500 mg Oral BID PRN Adonis BrookSheila Agustin, NP   500 mg at 01/06/15 1117  . nicotine polacrilex (NICORETTE) gum 2 mg  2 mg Oral PRN Adonis BrookSheila Agustin, NP   2 mg at 01/06/15 1117  . ondansetron (ZOFRAN-ODT) disintegrating tablet 4 mg  4 mg Oral Q6H PRN Adonis BrookSheila Agustin, NP      .  thiamine (VITAMIN B-1) tablet 100 mg  100 mg Oral Daily Adonis Brook, NP   100 mg at 01/06/15 1610    Lab Results: No results found for this or any previous visit (from the past 48 hour(s)).  Physical Findings: AIMS: Facial and Oral Movements Muscles of Facial Expression: None, normal Lips and Perioral Area: None, normal Jaw: None, normal Tongue: None, normal,Extremity Movements Upper (arms, wrists, hands, fingers): None, normal Lower (legs, knees, ankles, toes): None, normal, Trunk Movements Neck, shoulders, hips: None, normal, Overall Severity Severity of abnormal movements (highest score from questions above): None, normal Incapacitation due to abnormal movements: None, normal Patient's awareness of abnormal movements (rate only patient's report): No Awareness, Dental Status Current problems with teeth and/or dentures?: No Does patient usually wear dentures?: No  CIWA:  CIWA-Ar Total: 5 COWS:  COWS Total Score: 4  Treatment Plan Summary: Daily contact with patient to assess and evaluate symptoms and progress in treatment and Medication management Supportive approach/coping skills Opioid dependence; patient has completed the clonidine detox protocol  Benzodiazepine Abuse; 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  Continue Remeron 7.5 mg hs for  insomnia Will work a relapse prevention plan Trauma; will help to start processing the traumatic events she has been through PTSD; will reassess for the use of psychotropics as indicated Will work with CBT/mindfulness Will explore the need for a residential treatment program  Medical Decision Making:  Established Problem, Stable/Improving (1), Review of Psycho-Social Stressors (1), Review of Medication Regimen & Side Effects (2) and Review of New Medication or Change in Dosage (2)  Zanae Kuehnle NP-C 01/06/2015, 3:40 PM

## 2015-01-06 NOTE — [facility] Group Notes (Signed)
[facility] Group Notes:  (Clinical Social Work)  01/06/2015  10:00-11:00AM  Summary of Progress/Problems:   The main focus of today's process group was to   1)  discuss the importance of adding supports  2)  define health supports versus unhealthy supports  3)  identify the patient's current unhealthy supports and plan how to handle them  4)  Identify the patient's current healthy supports and plan what to add.  An emphasis was placed on using counselor, doctor, therapy groups, 12-step groups, and problem-specific support groups to expand supports.    The patient expressed full comprehension of the concepts presented, and agreed that there is a need to add more supports.  The patient stated that her family is a healthy support except her brother who she has had to cut out of her life.  She herself is unhealthy, she stated.  She stated she has regained custody of her 3 children, and spoke at length about the need to keep her brother out of her life and how guilty she feels about this.  Type of Therapy:  Process Group with Motivational Interviewing  Participation Level:  Active  Participation Quality:  Attentive, Sharing and Supportive  Affect:  Blunted  Cognitive:  Appropriate and Oriented  Insight:  Engaged  Engagement in Therapy:  Engaged  Modes of Intervention:   Education, Support and Processing, Activity  Ambrose MantleMareida Grossman-Orr, LCSW 01/06/2015

## 2015-01-06 NOTE — Progress Notes (Signed)
D: Pt's mood is depressed but she brightens when approached. She rates her depression 2/10 and hopelessness 2/10. She denies SI/HI/AVH. She states that her goal for today is to think positive.  A: Support given. Verbalization encouraged. Pt encouraged to come to staff for any concerns. Medications given as prescribed. R: Pt is receptive. No complaints of pain or discomfort at this time. Q15 min safety checks maintained. Pt remains safe on the unit. Will continue to monitor.

## 2015-01-06 NOTE — Progress Notes (Signed)
Patient did attend the evening speaker AA meeting.  

## 2015-01-07 MED ORDER — DULOXETINE HCL 30 MG PO CPEP: 30.0000 mg | ORAL_CAPSULE | Freq: Every day | ORAL | Status: DC

## 2015-01-07 MED ORDER — MIRTAZAPINE 7.5 MG PO TABS: 7.5000 mg | ORAL_TABLET | Freq: Every day | ORAL | Status: DC

## 2015-01-07 MED ORDER — DULOXETINE HCL 30 MG PO CPEP
30.0000 mg | ORAL_CAPSULE | Freq: Every day | ORAL | Status: DC
  Administered 2015-01-07: 30 mg via ORAL
  Filled 2015-01-07: qty 1
  Filled 2015-01-07: qty 14
  Filled 2015-01-07: qty 1

## 2015-01-07 MED ORDER — HYDROXYZINE HCL 50 MG PO TABS: 50.0000 mg | ORAL_TABLET | Freq: Four times a day (QID) | ORAL | Status: DC | PRN

## 2015-01-07 MED ORDER — NAPROXEN 500 MG PO TABS: 500.0000 mg | ORAL_TABLET | Freq: Two times a day (BID) | ORAL | Status: DC

## 2015-01-07 NOTE — Progress Notes (Signed)
Patient ID: Lynn Marshall, female   DOB: 03-16-1981, 34 y.o.   MRN: [id number] D: "I wish to discharge tomorrow, I want to see my children". Pt mood and affect appeared sad. Pt reports she is almost done with her protocol and wish to discharge tomorrow. Pt reports she has a psychiatrist outside and plans to follow up.  Cooperative with assessment.   A: Met with pt 1:1. Medications administered as prescribed. Writer encouraged pt to discuss feelings. Pt encouraged to come to staff with any question or concerns.   R: Patient remains safe. She is complaint with medications and denies any adverse reaction. Pt denies SI/HI/AVH. Pt attended evening AA group.

## 2015-01-07 NOTE — Progress Notes (Signed)
D: Pt mood is pleasant. She is looking forward to discharge. She denies SI/HI/AVH.  A: Support given. Verbalization encouraged. Pt encouraged to come to staff for any concerns. Medications given as prescribed. R: Pt is receptive. No complaints of pain or discomfort at this time. Q15 min safety checks maintained. Pt remains safe on the unit. Will continue to monitor.

## 2015-01-07 NOTE — Progress Notes (Signed)
Recreation Therapy Notes  Date: 01/07/15 Time: 9:30am Location: [address] Dayroom  Group Topic: Stress Management  Goal Area(s) Addresses:  Patient will verbalize importance of using healthy stress management.  Patient will identify positive emotions associated with healthy stress management.   Behavioral Response: Focused, engaged  Intervention: Stress Management  Activity :  Progressive Muscle Relaxation.  LRT introduced and educated patients on progressive muscle relaxation.  A script was used to deliver the technique to patients.  Patients were asked to follow the script read aloud by LRT to engage in practicing the stress management technique.  Education:  Stress Management, Discharge Planning.   Education Outcome: Acknowledges edcuation/In group clarification offered/Needs additional education  Clinical Observations/Feedback: Patient was engaged and focused on the activity.  Patient was able to block out peer who was being a distraction.  Caroll RancherMarjette Lorel Lembo, LRT/CTRS  Caroll RancherLindsay, Kenyetta Fife A 01/07/2015 4:27 PM

## 2015-01-07 NOTE — Progress Notes (Addendum)
Discharge orders received. Pt. Agreeable to discharge. AVS and prescriptions provided and reviewed. Pt. Verbalizes understanding of discharge instructions. Pt. Offers no questions or concerns. Pt. Denies SI, HI, pain or discomfort.  Pt. Belongings returned and signed for. Pt. Escorted to lobby.  

## 2015-01-07 NOTE — Plan of Care (Signed)
Problem: Diagnosis: Increased Risk For Suicide Attempt Goal: STG-Patient Will Comply With Medication Regime Outcome: Progressing Pt compliant with scheduled medication regime     

## 2015-01-07 NOTE — [facility] Group Notes (Signed)
[facility]   01/07/2015 10:13 AM  Participation Quality:  Appropriate   Mood/Affect:  Appropriate  Depression Rating:  1  Anxiety Rating:  3  Thoughts of Suicide:  No Will you contract for safety?   NA  Current AVH:  No  Plan for Discharge/Comments:  Pt reports that she is feeling good today and is ready for d/c. Pt asked that she make [facility] own [facility] and does not want records sent there. "I don't want them to know I relapsed." Pt requesting [place]-info provided to pt by CSW. Pt refusing all other referrals and denies withdrawal symptoms.   Transportation Means: boyfriend   Supports: boyfriend/3 children.   Smart, American FinancialHeather LCSWA
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
## 2015-01-07 NOTE — Progress Notes (Addendum)
  [facility] :  Will you be returning to the same living situation after discharge:  Yes,  home At discharge, do you have transportation home?: Yes,  boyfriend/fiance Do you have the ability to pay for your medications: Yes,  Medicaid  Release of information consent forms completed and submitted to Medical Records by CSW.  Patient to Follow up at: Follow-up Information    Follow up with Patient refused-she [facility] to schedule follow-up med mgmt/therapy appts. SHE DOES NOT WANT RECORDS SENT TO [facility]..    Pt also provided with [place] counseling information [place] [facility] per her request and was encouraged to call in order to schedule grief counseling at her convenience.   PT DOES NOT WANT RECORDS SENT TO ANY PROVIDER AND REFUSED ADDITIONAL FOLLOW-UP/PCP/INPATIENT TREATMENT REFERAL, SAIOP, ETC.   Patient denies SI/HI: Yes,  during group/self report.    Safety Planning and Suicide Prevention discussed: Yes,  SPE completed with pt. Contact attempts made with pt's boyfriend. SPI pamphlet provided to pt and she was encouraged to share information with support network, ask questions, and talk about any concerns relating to SPE.  Have you used any form of tobacco in the last 30 days? (Cigarettes, Smokeless Tobacco, Cigars, and/or Pipes): Yes  Has patient been referred to the Quitline?: Patient refused referral  Smart, Lebron QuamHeather LCSWA  01/07/2015, 10:16 AM

## 2015-01-07 NOTE — Progress Notes (Signed)
Pt attended spiritual care group on grief and loss facilitated by chaplain Burnis KingfisherMatthew Prescilla Monger. Group opened with brief discussion and psycho-social ed around grief and loss in relationships and in relation to self - identifying life patterns, circumstances, changes that cause losses. Established group norm of speaking from own life experience. Group goal of establishing open and affirming space for members to share loss and experience with grief, normalize grief experience and provide psycho social education and grief support.  Group drew on narrative and Alderian therapeutic modalities.    Annice PihJackie resonated with group's discussion of guilt as part of pain of grief and described the loss of her children's father to heroin overdose.  Annice PihJackie described wishing to live clean and be a good parent to her children in order to honor their father's memory.   Belva CromeStalnaker, Mihran Lebarron Wayne MDiv

## 2015-04-24 ENCOUNTER — Ambulatory Visit (INDEPENDENT_AMBULATORY_CARE_PROVIDER_SITE_OTHER): Payer: Medicaid Other | Admitting: Neurology

## 2015-04-24 ENCOUNTER — Encounter (INDEPENDENT_AMBULATORY_CARE_PROVIDER_SITE_OTHER): Payer: Medicaid Other

## 2015-04-24 ENCOUNTER — Encounter (INDEPENDENT_AMBULATORY_CARE_PROVIDER_SITE_OTHER): Payer: Self-pay

## 2015-04-24 DIAGNOSIS — R55 Syncope and collapse: Secondary | ICD-10-CM | POA: Diagnosis not present

## 2015-04-24 DIAGNOSIS — R404 Transient alteration of awareness: Secondary | ICD-10-CM
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
## 2015-04-25 ENCOUNTER — Telehealth: Payer: Self-pay

## 2015-04-25 NOTE — Telephone Encounter (Signed)
EEG results faxed to Dr. Rae Lips [phone]). Confirmation received.

## 2015-05-16 ENCOUNTER — Encounter (HOSPITAL_COMMUNITY): Payer: Self-pay | Admitting: Emergency Medicine

## 2015-05-16 ENCOUNTER — Emergency Department (HOSPITAL_COMMUNITY)
Admission: EM | Admit: 2015-05-16 | Discharge: 2015-05-16 | Disposition: A | Discharge status: Home / Self Care | Payer: No Typology Code available for payment source | Attending: Emergency Medicine | Admitting: Emergency Medicine

## 2015-05-16 ENCOUNTER — Telehealth: Payer: Self-pay | Admitting: *Deleted

## 2015-05-16 DIAGNOSIS — S0993XA Unspecified injury of face, initial encounter: Secondary | ICD-10-CM | POA: Insufficient documentation

## 2015-05-16 DIAGNOSIS — S199XXA Unspecified injury of neck, initial encounter: Secondary | ICD-10-CM | POA: Diagnosis not present

## 2015-05-16 DIAGNOSIS — Y999 Unspecified external cause status: Secondary | ICD-10-CM | POA: Diagnosis not present

## 2015-05-16 DIAGNOSIS — Z720 Tobacco use: Secondary | ICD-10-CM | POA: Insufficient documentation

## 2015-05-16 DIAGNOSIS — F329 Major depressive disorder, single episode, unspecified: Secondary | ICD-10-CM | POA: Diagnosis not present

## 2015-05-16 DIAGNOSIS — Z79899 Other long term (current) drug therapy: Secondary | ICD-10-CM | POA: Insufficient documentation

## 2015-05-16 DIAGNOSIS — Y9389 Activity, other specified: Secondary | ICD-10-CM | POA: Insufficient documentation

## 2015-05-16 DIAGNOSIS — S20312A Abrasion of left front wall of thorax, initial encounter: Secondary | ICD-10-CM | POA: Insufficient documentation

## 2015-05-16 DIAGNOSIS — F419 Anxiety disorder, unspecified: Secondary | ICD-10-CM | POA: Diagnosis not present

## 2015-05-16 DIAGNOSIS — Y92410 Unspecified street and highway as the place of occurrence of the external cause: Secondary | ICD-10-CM | POA: Diagnosis not present

## 2015-05-16 DIAGNOSIS — S50812A Abrasion of left forearm, initial encounter: Secondary | ICD-10-CM | POA: Diagnosis not present

## 2015-05-16 DIAGNOSIS — Z791 Long term (current) use of non-steroidal anti-inflammatories (NSAID): Secondary | ICD-10-CM | POA: Diagnosis not present

## 2015-05-16 MED ORDER — NAPROXEN 500 MG PO TABS: 500.0000 mg | ORAL_TABLET | Freq: Two times a day (BID) | ORAL | Status: DC

## 2015-05-16 MED ORDER — CYCLOBENZAPRINE HCL 10 MG PO TABS: 10.0000 mg | ORAL_TABLET | Freq: Two times a day (BID) | ORAL | Status: DC | PRN
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
## 2015-05-16 NOTE — Discharge Instructions (Signed)

## 2015-05-16 NOTE — ED Notes (Signed)
Discharge prescriptions called to [facility] [phone]) per patient request.
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
## 2015-05-27 ENCOUNTER — Other Ambulatory Visit (HOSPITAL_COMMUNITY): Payer: Self-pay | Admitting: Respiratory Therapy

## 2015-05-27 DIAGNOSIS — G4733 Obstructive sleep apnea (adult) (pediatric): Secondary | ICD-10-CM

## 2015-08-06 ENCOUNTER — Emergency Department (HOSPITAL_COMMUNITY)
Admission: EM | Admit: 2015-08-06 | Discharge: 2015-08-06 | Disposition: A | Discharge status: Home / Self Care | Payer: Medicaid Other | Attending: Emergency Medicine | Admitting: Emergency Medicine

## 2015-08-06 ENCOUNTER — Encounter (HOSPITAL_COMMUNITY): Payer: Self-pay

## 2015-08-06 DIAGNOSIS — F419 Anxiety disorder, unspecified: Secondary | ICD-10-CM | POA: Insufficient documentation

## 2015-08-06 DIAGNOSIS — F329 Major depressive disorder, single episode, unspecified: Secondary | ICD-10-CM | POA: Diagnosis not present

## 2015-08-06 DIAGNOSIS — W268XXD Contact with other sharp object(s), not elsewhere classified, subsequent encounter: Secondary | ICD-10-CM | POA: Diagnosis not present

## 2015-08-06 DIAGNOSIS — S61212D Laceration without foreign body of right middle finger without damage to nail, subsequent encounter: Secondary | ICD-10-CM | POA: Insufficient documentation

## 2015-08-06 DIAGNOSIS — Z4801 Encounter for change or removal of surgical wound dressing: Secondary | ICD-10-CM | POA: Diagnosis present

## 2015-08-06 DIAGNOSIS — Z5189 Encounter for other specified aftercare: Secondary | ICD-10-CM

## 2015-08-06 DIAGNOSIS — Z791 Long term (current) use of non-steroidal anti-inflammatories (NSAID): Secondary | ICD-10-CM | POA: Diagnosis not present

## 2015-08-06 DIAGNOSIS — F17210 Nicotine dependence, cigarettes, uncomplicated: Secondary | ICD-10-CM | POA: Insufficient documentation

## 2015-08-06 DIAGNOSIS — Z79899 Other long term (current) drug therapy: Secondary | ICD-10-CM | POA: Diagnosis not present

## 2015-08-06 DIAGNOSIS — S61219D Laceration without foreign body of unspecified finger without damage to nail, subsequent encounter: Secondary | ICD-10-CM

## 2015-08-06 NOTE — ED Notes (Signed)
See PA Note.

## 2015-08-06 NOTE — ED Notes (Addendum)
Per PT, Pt cut her hand and had it stitched up on Thursday with a box cutter. Pt [place] it [place] in [place] and had Tetanus shot completed. Due to finances, Pt was unable to get antibiotics. Pt reports increase pain to move the middle finger. Pt has laceration stitched up in the middle finger of the left hand, slight erythema noted with no purulent drainage noted. Pt denies nausea, vomiting, diarrhea. PA at the bedside.

## 2015-08-06 NOTE — Discharge Instructions (Signed)
Go to your PCP, an urgent care or return to the ED in 2 days for suture removal.

## 2015-08-06 NOTE — ED Provider Notes (Signed)
CSN: [id number]     Arrival date & time 08/06/15  0801 History   None    Chief Complaint  Patient presents with  . Hand Injury   HPI  Ms. Lynn Marshall is a 34 year old female with a past medical history of anxiety presenting for a wound check. She reports cutting her left middle finger with a box cutter 5 days ago. She then went to the emergency department in [facility] for laceration repair. She states that she was prescribed antibiotics and ibuprofen at that time but she was unable to fill the prescription due to financial issues. She states that she is having persistent pain and numbness at the laceration site. She also notes that she "popped a stitch" when she picked up her child a few days ago. She states that a friend looked at her finger yesterday and told her that it looked infected which prompted her ER visit today. She denies fevers, chills, nausea, vomiting, diarrhea, purulent drainage from the wound, inability to move the fingers, swelling of the fingers/hand or increased erythema from the wound. She states that her friend thought it was infected because "it was red around my stitches and there is that yellow crusting". She does have a follow-up appointment in 2 days for suture removal. She had her tetanus updated at the time of her laceration.  Past Medical History  Diagnosis Date  . Depression   . Anxiety    Past Surgical History  Procedure Laterality Date  . Cesarean section    . Tonsillectomy     History reviewed. No pertinent family history. Social History  Substance Use Topics  . Smoking status: Current Every Day Smoker -- 1.00 packs/day    Types: Cigarettes  . Smokeless tobacco: Never Used  . Alcohol Use: Yes     Comment: occassionally   OB History    No data available     Review of Systems  Skin: Positive for wound.  All other systems reviewed and are negative.     Allergies  Review of patient's allergies indicates no known allergies.  Home Medications   Prior  to Admission medications   Medication Sig Start Date End Date Taking? Authorizing Provider  DULoxetine (CYMBALTA) 30 MG capsule Take 1 capsule (30 mg total) by mouth daily. For depression 01/07/15  Yes Sanjuana Kava, NP  gabapentin (NEURONTIN) 300 MG capsule Take 300 mg by mouth 3 (three) times daily.   Yes Historical Provider, MD  cyclobenzaprine (FLEXERIL) 10 MG tablet Take 1 tablet (10 mg total) by mouth 2 (two) times daily as needed for muscle spasms. 05/16/15   Arthor Captain, PA-C  hydrOXYzine (ATARAX/VISTARIL) 50 MG tablet Take 1 tablet (50 mg total) by mouth every 6 (six) hours as needed for anxiety. 01/07/15   Sanjuana Kava, NP  mirtazapine (REMERON) 7.5 MG tablet Take 1 tablet (7.5 mg total) by mouth at bedtime. For insomnia 01/07/15   Sanjuana Kava, NP  naproxen (NAPROSYN) 500 MG tablet Take 1 tablet (500 mg total) by mouth 2 (two) times daily. For moderate pain 05/16/15   Arthor Captain, PA-C   BP 129/84 mmHg  Pulse 78  Temp(Src) 98.5 F (36.9 C) (Oral)  Resp 16  SpO2 96% Physical Exam  Constitutional: She appears well-developed and well-nourished. No distress.  HENT:  Head: Normocephalic and atraumatic.  Eyes: Conjunctivae are normal. Right eye exhibits no discharge. Left eye exhibits no discharge. No scleral icterus.  Neck: Normal range of motion.  Cardiovascular: Normal rate, regular rhythm  and intact distal pulses.   Radial pulse palpable. Cap refill less than 2 seconds.  Pulmonary/Chest: Effort normal. No respiratory distress.  Musculoskeletal: Normal range of motion.       Hands: Full range of motion of the digits and wrist on the left hand intact. No edema of the digits or hand. Mild tenderness to palpation immediately over the laceration site. No tenderness over the tip of the finger or palmar aspect of the hand.  Neurological: She is alert. Coordination normal.  5 out of 5 grip strength of the left hand. Sensation to light touch intact over the hand and digits.  Skin:  Skin is warm and dry.  3 cm laceration noted over the volar aspect of the third digit middle phalanx of the left hand. Faint erythema noted surrounding the laceration without streaking. Laceration is healing well without purulent drainage or obvious abscess.  Psychiatric: She has a normal mood and affect. Her behavior is normal.  Nursing note and vitals reviewed.   ED Course  Procedures (including critical care time) Labs Review Labs Reviewed - No data to display  Imaging Review No results found. I have personally reviewed and evaluated these images and lab results as part of my medical decision-making.   EKG Interpretation None      MDM   Final diagnoses:  Finger laceration, subsequent encounter  Visit for wound check   34 year old female presenting for a wound check. Patient had cut her finger on a box cutter 5 days ago with subsequent suture repair by another emergency department. Afebrile. 3 cm laceration with normal wound healing noted to the volar aspect of the third digit of the left hand. No streaking, purulent discharge or obvious abscess. Retains painless full range of motion of the digit. Cap refill less than 2 seconds. No signs of infection at this time. Patient is concerned about numbness around the laceration which is consistent with normal wound healing. She states she is unable to fill her antibiotic prescription written by the other emergency department that gets her paycheck today so she will be able to fill it tomorrow. Encouraged her to keep her follow-up appointment in 2 days for suture removal and final wound check. Discussed signs of infection and other reasons to return to the emergency department. Return precautions given in discharge paperwork and discussed with pt at bedside. Pt stable for discharge     Alveta HeimlichStevi Cyanna Neace, PA-C 08/06/15 0902  Tilden FossaElizabeth Rees, MD 08/07/15 [phone]

## 2015-11-20 ENCOUNTER — Encounter (HOSPITAL_COMMUNITY): Payer: Self-pay | Admitting: Emergency Medicine

## 2015-11-20 DIAGNOSIS — F1920 Other psychoactive substance dependence, uncomplicated: Secondary | ICD-10-CM | POA: Diagnosis not present

## 2015-11-20 DIAGNOSIS — F419 Anxiety disorder, unspecified: Secondary | ICD-10-CM | POA: Insufficient documentation

## 2015-11-20 DIAGNOSIS — Z79899 Other long term (current) drug therapy: Secondary | ICD-10-CM | POA: Insufficient documentation

## 2015-11-20 DIAGNOSIS — F1110 Opioid abuse, uncomplicated: Secondary | ICD-10-CM | POA: Diagnosis not present

## 2015-11-20 DIAGNOSIS — F319 Bipolar disorder, unspecified: Secondary | ICD-10-CM | POA: Diagnosis not present

## 2015-11-20 DIAGNOSIS — F1410 Cocaine abuse, uncomplicated: Secondary | ICD-10-CM | POA: Insufficient documentation

## 2015-11-20 DIAGNOSIS — F39 Unspecified mood [affective] disorder: Secondary | ICD-10-CM | POA: Insufficient documentation

## 2015-11-20 DIAGNOSIS — F1310 Sedative, hypnotic or anxiolytic abuse, uncomplicated: Secondary | ICD-10-CM | POA: Insufficient documentation

## 2015-11-20 DIAGNOSIS — Z79891 Long term (current) use of opiate analgesic: Secondary | ICD-10-CM | POA: Insufficient documentation

## 2015-11-20 DIAGNOSIS — Z9851 Tubal ligation status: Secondary | ICD-10-CM | POA: Insufficient documentation

## 2015-11-20 DIAGNOSIS — F4310 Post-traumatic stress disorder, unspecified: Secondary | ICD-10-CM | POA: Insufficient documentation

## 2015-11-20 DIAGNOSIS — F909 Attention-deficit hyperactivity disorder, unspecified type: Secondary | ICD-10-CM | POA: Diagnosis not present

## 2015-11-20 DIAGNOSIS — F17210 Nicotine dependence, cigarettes, uncomplicated: Secondary | ICD-10-CM | POA: Diagnosis not present

## 2015-11-20 DIAGNOSIS — F329 Major depressive disorder, single episode, unspecified: Secondary | ICD-10-CM | POA: Diagnosis present

## 2015-11-20 NOTE — ED Notes (Signed)
Pt. requesting psychiatric evaluation for her depression and detox for her Heroin addiction , denies suicidal ideation / no hallucinations .

## 2015-11-21 ENCOUNTER — Emergency Department (HOSPITAL_COMMUNITY)
Admission: EM | Admit: 2015-11-21 | Discharge: 2015-11-21 | Disposition: A | Discharge status: Home / Self Care | Payer: Medicaid Other | Attending: Emergency Medicine | Admitting: Emergency Medicine

## 2015-11-21 DIAGNOSIS — F1920 Other psychoactive substance dependence, uncomplicated: Secondary | ICD-10-CM | POA: Diagnosis not present

## 2015-11-21 DIAGNOSIS — F1120 Opioid dependence, uncomplicated: Secondary | ICD-10-CM

## 2015-11-21 DIAGNOSIS — F1910 Other psychoactive substance abuse, uncomplicated: Secondary | ICD-10-CM | POA: Diagnosis present

## 2015-11-21 DIAGNOSIS — F39 Unspecified mood [affective] disorder: Secondary | ICD-10-CM

## 2015-11-21 DIAGNOSIS — F4310 Post-traumatic stress disorder, unspecified: Secondary | ICD-10-CM | POA: Diagnosis not present

## 2015-11-21 HISTORY — DX: Post-traumatic stress disorder, unspecified: F43.10

## 2015-11-21 HISTORY — DX: Bipolar disorder, unspecified: F31.9

## 2015-11-21 HISTORY — DX: Attention-deficit hyperactivity disorder, unspecified type: F90.9

## 2015-11-21 HISTORY — DX: Opioid dependence, uncomplicated: F11.20

## 2015-11-21 LAB — CBC WITH DIFFERENTIAL/PLATELET
BASOS ABS: 0 10*3/uL (ref 0.0–0.1)
Basophils Relative: 0 %
Eosinophils Absolute: 0.1 10*3/uL (ref 0.0–0.7)
Eosinophils Relative: 2 %
HEMATOCRIT: 35.7 % — AB (ref 36.0–46.0)
HEMOGLOBIN: 11.7 g/dL — AB (ref 12.0–15.0)
LYMPHS PCT: 41 %
Lymphs Abs: 3.6 10*3/uL (ref 0.7–4.0)
MCH: 27.4 pg (ref 26.0–34.0)
MCHC: 32.8 g/dL (ref 30.0–36.0)
MCV: 83.6 fL (ref 78.0–100.0)
MONO ABS: 0.6 10*3/uL (ref 0.1–1.0)
Monocytes Relative: 7 %
NEUTROS ABS: 4.3 10*3/uL (ref 1.7–7.7)
NEUTROS PCT: 50 %
Platelets: 271 10*3/uL (ref 150–400)
RBC: 4.27 MIL/uL (ref 3.87–5.11)
RDW: 15.8 % — AB (ref 11.5–15.5)
WBC: 8.7 10*3/uL (ref 4.0–10.5)

## 2015-11-21 LAB — COMPREHENSIVE METABOLIC PANEL
ALBUMIN: 3.8 g/dL (ref 3.5–5.0)
ALT: 19 U/L (ref 14–54)
ANION GAP: 8 (ref 5–15)
AST: 23 U/L (ref 15–41)
Alkaline Phosphatase: 75 U/L (ref 38–126)
BUN: 11 mg/dL (ref 6–20)
CHLORIDE: 104 mmol/L (ref 101–111)
CO2: 26 mmol/L (ref 22–32)
Calcium: 9.1 mg/dL (ref 8.9–10.3)
Creatinine, Ser: 0.75 mg/dL (ref 0.44–1.00)
GFR calc Af Amer: >60 mL/min (ref >60)
GFR calc non Af Amer: >60 mL/min (ref >60)
GLUCOSE: 112 mg/dL — AB (ref 65–99)
POTASSIUM: 3.7 mmol/L (ref 3.5–5.1)
Sodium: 138 mmol/L (ref 135–145)
Total Bilirubin: 0.4 mg/dL (ref 0.3–1.2)
Total Protein: 7.3 g/dL (ref 6.5–8.1)

## 2015-11-21 LAB — RAPID URINE DRUG SCREEN, HOSP PERFORMED
Amphetamines: NOT DETECTED
BARBITURATES: NOT DETECTED
BENZODIAZEPINES: POSITIVE — AB
Cocaine: POSITIVE — AB
Opiates: POSITIVE — AB
Tetrahydrocannabinol: NOT DETECTED

## 2015-11-21 LAB — ETHANOL

## 2015-11-21 NOTE — ED Notes (Signed)
TTS still in progress at this time.  

## 2015-11-21 NOTE — Consult Note (Signed)
Telepsych Consultation   Reason for Consult:  Polysubstance abuse Referring Physician:  EDP Patient Identification: Lynn Marshall MRN:  [id number] Principal Diagnosis: Polysubstance abuse Diagnosis:   Patient Active Problem List   Diagnosis Date Noted  . Polysubstance dependence including opioid drug with daily use ([place]) [F19.20] 01/04/2015    Priority: High  . PTSD (post-traumatic stress disorder) [F43.10] 01/04/2015    Priority: Medium  . Polysubstance abuse [F19.10] 01/03/2015    Priority: Medium  . Opioid type dependence, continuous ([place]) [F11.20] 01/04/2015    Total Time spent with patient: 30 minutes  Subjective:   Lynn Marshall is a 35 y.o. female patient admitted with reports of polysubstance abuse and a desire to detox and change her psychotropic medications. Pt seen and chart reviewed. Pt is alert/oriented x4, calm, cooperative, and appropriate to situation. Pt denies suicidal/homicidal ideation and psychosis and does not appear to be responding to internal stimuli. Pt reports that she has been upset with Dr. Darleene Cleaver at [facility] and that "he started me on like 15 different meds and he won't give me the Adderall with my other meds." When asked about her opiates, cocaine, and benzo dependence, pt reports that this is not related to prescribing. Counseled pt on the severe dangers of interactions and prescribing guidelines that prevent [place] from giving meds that could be a danger to her when we know she is abusing numerous recreational drugs. Pt was tearful but affirmed understanding and was more optimistic in her affect when informed that we would provide alternative resources so that she could choose a provider aside from [facility].  HPI:  I have reviewed HPI elements below, modified as followed:  Lynn Marshall is an 35 y.o. female. Clinician reviewed note by Dr. Ralene Bathe. Patient came in because she is wanting help with her depression and her medications. She says  that she has been up for the last 3 days. Patient was very sleepy during assessment. She has paperwork with her to show that she has had clean drug screens. 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 [facility] The old one she liked. The new one told her he would not prescribe adderall to her because she had SA problems. Patient says that she has been "self medicating." She is positive for benzos, opiates and cocaine.  Patient denies that she wants to kill herself or harm anyone else. Patient says that two days ago she was having A/V hallucinations. Patient says she is not having them now. She admits to having severe depression. She wants to get help with her medications.   [facility] Patient says that her father died of a heroin overdose when she was [age] Her mother had heroin addiction for many years. The father of one of her children died of a overdose also.   Pt spent the night in the ED without incident and has been cooperative. Seen by psychiatry as above today on 11/21/2015  Past Psychiatric History: polysubstance abuse, MDD, GAD  Risk to Self: Suicidal Ideation: No Suicidal Intent: No Is patient at risk for suicide?: No Suicidal Plan?: No Access to Means: No What has been your use of drugs/alcohol within the last 12 months?: Cocaine How many times?: 0  Other Self Harm Risks: None Triggers for Past Attempts: None known Intentional Self Injurious Behavior: None Risk to Others: Homicidal Ideation: No Thoughts of Harm to Others: No Current Homicidal Intent: No Current Homicidal Plan: No Access to Homicidal Means: No Identified Victim: No one History of harm to others?: No Assessment of Violence: None Noted Violent  Behavior Description: Pt denies Does patient have access to weapons?: No Criminal Charges Pending?: No Does patient have a court date: No Prior Inpatient Therapy: Prior Inpatient Therapy: Yes Prior Therapy Dates: 05/16, 2013 Prior Therapy Facilty/Provider(s): [facility]; [facility] Reason for Treatment: SA, SI Prior Outpatient Therapy: Prior Outpatient Therapy: Yes Prior Therapy Dates: One year; One year also Prior Therapy Facilty/Provider(s): [facility]; [facility] Reason for Treatment: med managment and methadone tx Does patient have an ACCT team?: No Does patient have Intensive In-House Services?  : No Does patient have [facility] services? : No Does patient have P4CC services?: No  Past Medical History:  Past Medical History  Diagnosis Date  . Depression   . Anxiety   . PTSD (post-traumatic stress disorder)   . ADHD (attention deficit hyperactivity disorder)   . Bipolar 1 disorder ([place])   . Heroin addiction [facility])     Past Surgical History  Procedure Laterality Date  . Cesarean section    . Tonsillectomy     Family History: No family history on file. Family Psychiatric  History: unknown Social History:  History  Alcohol Use  . Yes    Comment: occassionally     History  Drug Use No    Comment: heroin; uses 14m of methadone     Social History   Social History  . Marital Status: Single    Spouse Name: N/A  . Number of Children: N/A  . Years of Education: N/A   Social History Main Topics  . Smoking status: Current Every Day Smoker -- 0.00 packs/day    Types: Cigarettes  . Smokeless tobacco: Never Used  . Alcohol Use: Yes     Comment: occassionally  . Drug Use: No     Comment: heroin; uses 688mof methadone   . Sexual Activity: Not Asked   Other Topics Concern  . None   Social History Narrative   Additional Social History:    Allergies:  No Known Allergies  Labs:  Results for orders placed or performed during the hospital encounter of 11/21/15 (from the past 48  hour(s))  Urine rapid drug [facility]hosp [facility]   Status: Abnormal   Collection Time: 11/20/15 11:25 PM  Result Value Ref Range   Opiates POSITIVE (A) NONE DETECTED   Cocaine POSITIVE (A) NONE DETECTED   Benzodiazepines POSITIVE (A) NONE DETECTED   Amphetamines NONE DETECTED NONE DETECTED   Tetrahydrocannabinol NONE DETECTED NONE DETECTED   Barbiturates NONE DETECTED NONE DETECTED    Comment:        DRUG SCREEN FOR MEDICAL PURPOSES ONLY.  IF CONFIRMATION IS NEEDED FOR ANY PURPOSE, NOTIFY LAB WITHIN 5 DAYS.        LOWEST DETECTABLE LIMITS FOR URINE DRUG SCREEN Drug Class       Cutoff (ng/mL) Amphetamine      1000 Barbiturate      200 Benzodiazepine   20497ricyclics       30026piates          300 Cocaine          300 THC  50   Comprehensive metabolic panel     Status: Abnormal   Collection Time: 11/21/15 12:27 AM  Result Value Ref Range   Sodium 138 135 - 145 mmol/L   Potassium 3.7 3.5 - 5.1 mmol/L   Chloride 104 101 - 111 mmol/L   CO2 26 22 - 32 mmol/L   Glucose, Bld 112 (H) 65 - 99 mg/dL   BUN 11 6 - 20 mg/dL   Creatinine, Ser 0.75 0.44 - 1.00 mg/dL   Calcium 9.1 8.9 - 10.3 mg/dL   Total Protein 7.3 6.5 - 8.1 g/dL   Albumin 3.8 3.5 - 5.0 g/dL   AST 23 15 - 41 U/L   ALT 19 14 - 54 U/L   Alkaline Phosphatase 75 38 - 126 U/L   Total Bilirubin 0.4 0.3 - 1.2 mg/dL   GFR calc non Af Amer >60 >60 mL/min   GFR calc Af Amer >60 >60 mL/min    Comment: (NOTE) The eGFR has been calculated using the CKD EPI equation. This calculation has not been validated in all clinical situations. eGFR's persistently <60 mL/min signify possible Chronic Kidney Disease.    Anion gap 8 5 - 15  CBC with Diff     Status: Abnormal   Collection Time: 11/21/15 12:27 AM  Result Value Ref Range   WBC 8.7 4.0 - 10.5 K/uL   RBC 4.27 3.87 - 5.11 MIL/uL   Hemoglobin 11.7 (L) 12.0 - 15.0 g/dL   HCT 35.7 (L) 36.0 - 46.0 %   MCV 83.6 78.0 - 100.0 fL   MCH 27.4 26.0 - 34.0  pg   MCHC 32.8 30.0 - 36.0 g/dL   RDW 15.8 (H) 11.5 - 15.5 %   Platelets 271 150 - 400 K/uL   Neutrophils Relative % 50 %   Neutro Abs 4.3 1.7 - 7.7 K/uL   Lymphocytes Relative 41 %   Lymphs Abs 3.6 0.7 - 4.0 K/uL   Monocytes Relative 7 %   Monocytes Absolute 0.6 0.1 - 1.0 K/uL   Eosinophils Relative 2 %   Eosinophils Absolute 0.1 0.0 - 0.7 K/uL   Basophils Relative 0 %   Basophils Absolute 0.0 0.0 - 0.1 K/uL  Ethanol     Status: None   Collection Time: 11/21/15 12:28 AM  Result Value Ref Range   Alcohol, Ethyl (B) <5 <5 mg/dL    Comment:        LOWEST DETECTABLE LIMIT FOR SERUM ALCOHOL IS 5 mg/dL FOR MEDICAL PURPOSES ONLY     No current facility-administered medications for this encounter.   Current Outpatient Prescriptions  Medication Sig Dispense Refill  . amphetamine-dextroamphetamine (ADDERALL XR) 25 MG 24 hr capsule Take 25 mg by mouth every morning.    Marland Kitchen buPROPion (WELLBUTRIN XL) 300 MG 24 hr tablet Take 300 mg by mouth daily.    . cyclobenzaprine (FLEXERIL) 10 MG tablet Take 1 tablet (10 mg total) by mouth 2 (two) times daily as needed for muscle spasms. 20 tablet 0  . gabapentin (NEURONTIN) 300 MG capsule Take 600 mg by mouth 2 (two) times daily.     . hydrOXYzine (ATARAX/VISTARIL) 50 MG tablet Take 1 tablet (50 mg total) by mouth every 6 (six) hours as needed for anxiety. 45 tablet 0  . methadone (DOLOPHINE) 1 MG/1ML solution Take 80 mg by mouth daily.    . prazosin (MINIPRESS) 5 MG capsule Take 5 mg by mouth at bedtime as needed (sleep).    . sertraline (ZOLOFT) 100 MG  tablet Take 100 mg by mouth daily.      Musculoskeletal: UTO, camera  Psychiatric Specialty Exam: Review of Systems  Psychiatric/Behavioral: Positive for depression and substance abuse. Negative for suicidal ideas and hallucinations. The patient is nervous/anxious and has insomnia.   All other systems reviewed and are negative.   Blood pressure 117/54, pulse 62, temperature 98.2 F (36.8 C),  temperature source Oral, resp. rate 20, height '5\' 6"'  (1.676 m), weight 92.534 kg (204 lb), last menstrual period 11/17/2015, SpO2 99 %.Body mass index is 32.94 kg/(m^2).  General Appearance: Casual and Fairly Groomed  Engineer, water::  Good  Speech:  Clear and Coherent and Normal Rate  Volume:  Normal  Mood:  Anxious and Depressed  Affect:  Appropriate and Congruent  Thought Process:  Coherent and Goal Directed  Orientation:  Full (Time, Place, and Person)  Thought Content:  WDL  Suicidal Thoughts:  No  Homicidal Thoughts:  No  Memory:  Immediate;   Fair Recent;   Fair Remote;   Fair  Judgement:  Fair  Insight:  Fair  Psychomotor Activity:  Normal  Concentration:  Fair  Recall:  [employer] of Knowledge:Fair  Language: Fair  Akathisia:  No  Handed:    AIMS (if indicated):     Assets:  Communication Skills Desire for Improvement Physical Health Resilience Social Support  ADL's:  Intact  Cognition: WNL  Sleep:      Treatment Plan Summary: Polysubstance abuse, improving, stable for outpatient management  Disposition: No evidence of imminent risk to self or others at present.   Patient does not meet criteria for psychiatric inpatient admission. Supportive therapy provided about ongoing stressors. Refer to IOP. Discussed crisis plan, support from social network, calling 911, coming to the Emergency Department, and calling Suicide Hotline. Discharge with resources for substance abuse, psychiatry, counseling  Benjamine Mola, [place] 11/21/2015 10:58 AM
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
## 2015-11-21 NOTE — ED Notes (Signed)
Pt placed in room and no response in waiting room. Pt was seen by nurse first staff walking outside of ER.

## 2015-11-21 NOTE — Progress Notes (Signed)
Per psych NP, pt interested in changing outpatient mental health providers ([facility]  Pt has MCD coverage. Provided options in area who are able to accept this coverage in pt's d/c summary.  Ilean SkillMeghan Layci Stenglein, MSW, LCSW Clinical Social Work, Disposition  11/21/2015 [phone]

## 2015-11-21 NOTE — ED Notes (Signed)
Patient

## 2015-11-21 NOTE — ED Notes (Signed)
Patient is changing in to blue scrubs.

## 2015-11-21 NOTE — ED Notes (Signed)
TTS in patient's room. [facility]

## 2015-11-21 NOTE — ED Notes (Signed)
Patient was wanded by security.    Pharmacy Tech is looking in to patients dosage and last administration of Methadone given per request of Dr. Effie ShyWentz.

## 2015-11-21 NOTE — ED Provider Notes (Addendum)
CSN: [id number]     Arrival date & time 11/20/15  2218 History  By signing my name below, I, Rohini Rajnarayanan, attest that this documentation has been prepared under the direction and in the presence of Tilden FossaElizabeth Keontae Levingston, MD Electronically Signed: Charlean Merlohini Rajnarayanan, ED Scribe 11/21/2015 at 2:35 AM.   Chief Complaint  Patient presents with  . Depression  . Drug Problem   The history is provided by the patient. No language interpreter was used.    HPI Comments: Lynn Marshall is a 35 y.o. female with a pshx of a tubal ligation, who presents to the Emergency Department requesting psychiatric help with her mental health medications. She states that she is having no relief from the medication that she is currently prescribed. [facility] >[facility]. [facility] has prescribed the pt numerous medications, some of which has induced her depression. Pt had been using heroin for 8 years after the birth of her second child. She lost her father to a heroin overdose when she was 35 years old and has vast social exposure to heroin overdoses after that time as well. Pt is currently seeking help for her addiction. She is currently taking Methadone. Pt is in recovery and states that she may have done Heroin 2x in the past 4 years.  Pt is currently engaged, and in the process of buying a home with her fiance. She received her 3 children back 2 years ago after recovery from her heroin addiction.  Pt denies any SI at this time. She is concerned if her psychiatric medications are not properly adjusted that she will continue to worsen.  Past Medical History  Diagnosis Date  . Depression   . Anxiety   . PTSD (post-traumatic stress disorder)   . ADHD (attention deficit hyperactivity disorder)   . Bipolar 1 disorder (HCC)   . Heroin addiction [facility](HCC)    Past Surgical History  Procedure Laterality Date  . Cesarean section    . Tonsillectomy     No family history on  file. Social History  Substance Use Topics  . Smoking status: Current Every Day Smoker -- 0.00 packs/day    Types: Cigarettes  . Smokeless tobacco: Never Used  . Alcohol Use: Yes     Comment: occassionally   OB History    No data available     Review of Systems  Constitutional: Negative for fever.  Psychiatric/Behavioral: Positive for dysphoric mood. Negative for suicidal ideas.  All other systems reviewed and are negative.  Allergies  Review of patient's allergies indicates no known allergies.  Home Medications   Prior to Admission medications   Medication Sig Start Date End Date Taking? Authorizing Provider  amphetamine-dextroamphetamine (ADDERALL XR) 25 MG 24 hr capsule Take 25 mg by mouth every morning.   Yes Historical Provider, MD  buPROPion (WELLBUTRIN XL) 300 MG 24 hr tablet Take 300 mg by mouth daily.   Yes Historical Provider, MD  cyclobenzaprine (FLEXERIL) 10 MG tablet Take 1 tablet (10 mg total) by mouth 2 (two) times daily as needed for muscle spasms. 05/16/15  Yes Arthor CaptainAbigail Harris, PA-C  gabapentin (NEURONTIN) 300 MG capsule Take 600 mg by mouth 2 (two) times daily.    Yes Historical Provider, MD  hydrOXYzine (ATARAX/VISTARIL) 50 MG tablet Take 1 tablet (50 mg total) by mouth every 6 (six) hours as needed for anxiety. 01/07/15  Yes Sanjuana KavaAgnes I Nwoko, NP  prazosin (MINIPRESS) 5 MG capsule Take 5 mg  by mouth at bedtime as needed (sleep).   Yes Historical Provider, MD  sertraline (ZOLOFT) 100 MG tablet Take 100 mg by mouth daily.   Yes Historical Provider, MD   BP 116/72 mmHg  Pulse 71  Temp(Src) 98.3 F (36.8 C) (Oral)  Resp 14  Ht  (1.676 m)  Wt 204 lb (92.534 kg)  BMI 32.94 kg/m2  SpO2 98%  LMP 11/17/2015 Physical Exam  Constitutional: She is oriented to person, place, and time. She appears well-developed and well-nourished.  HENT:  Head: Normocephalic and atraumatic.  Cardiovascular: Normal rate and regular rhythm.   Pulmonary/Chest: Effort normal. No  respiratory distress.  Musculoskeletal: Normal range of motion.  Neurological: She is alert and oriented to person, place, and time.  Skin: Skin is warm.  Psychiatric:  Flat affect  Nursing note and vitals reviewed.   ED Course  Procedures  DIAGNOSTIC STUDIES: Oxygen Saturation is 98% on RA, normal by my interpretation.    COORDINATION OF CARE: 2:18 AM Discussed treatment plan with pt at bedside and pt agreed to plan.  Labs Review Labs Reviewed  COMPREHENSIVE METABOLIC PANEL - Abnormal; Notable for the following:    Glucose, Bld 112 (*)    All other components within normal limits  CBC WITH DIFFERENTIAL/PLATELET - Abnormal; Notable for the following:    Hemoglobin 11.7 (*)    HCT 35.7 (*)    RDW 15.8 (*)    All other components within normal limits  URINE RAPID DRUG SCREEN, HOSP PERFORMED - Abnormal; Notable for the following:    Opiates POSITIVE (*)    Cocaine POSITIVE (*)    Benzodiazepines POSITIVE (*)    All other components within normal limits  ETHANOL    Imaging Review No results found. I have personally reviewed and evaluated these images and lab results as part of my medical decision-making.   EKG Interpretation None      MDM   Final diagnoses:  Mood disorder [facility])   Patient here for evaluation of mood disorder and dysphoria, on multiple psychiatric medications and has a history of substance abuse. She is not currently suicidal. She has been medically cleared for psychiatric evaluation.  I personally performed the services described in this documentation, which was scribed in my presence. The recorded information has been reviewed and is accurate.     Tilden Fossa, MD 11/21/15 [id number]  Tilden Fossa, MD 11/21/15 [phone]

## 2016-03-11 ENCOUNTER — Encounter (HOSPITAL_COMMUNITY): Payer: Self-pay | Admitting: Emergency Medicine

## 2016-03-11 ENCOUNTER — Emergency Department (HOSPITAL_COMMUNITY)
Admission: EM | Admit: 2016-03-11 | Discharge: 2016-03-12 | Disposition: A | Discharge status: Other Acute Inpatient Hospital | Payer: Medicaid Other | Attending: Emergency Medicine | Admitting: Emergency Medicine

## 2016-03-11 DIAGNOSIS — F419 Anxiety disorder, unspecified: Secondary | ICD-10-CM

## 2016-03-11 DIAGNOSIS — F1920 Other psychoactive substance dependence, uncomplicated: Secondary | ICD-10-CM | POA: Diagnosis present

## 2016-03-11 DIAGNOSIS — F1110 Opioid abuse, uncomplicated: Secondary | ICD-10-CM | POA: Diagnosis not present

## 2016-03-11 DIAGNOSIS — F1120 Opioid dependence, uncomplicated: Secondary | ICD-10-CM | POA: Diagnosis not present

## 2016-03-11 DIAGNOSIS — F32A Depression, unspecified: Secondary | ICD-10-CM

## 2016-03-11 DIAGNOSIS — Z79899 Other long term (current) drug therapy: Secondary | ICD-10-CM | POA: Insufficient documentation

## 2016-03-11 DIAGNOSIS — F1410 Cocaine abuse, uncomplicated: Secondary | ICD-10-CM | POA: Diagnosis not present

## 2016-03-11 DIAGNOSIS — Z046 Encounter for general psychiatric examination, requested by authority: Secondary | ICD-10-CM | POA: Diagnosis present

## 2016-03-11 DIAGNOSIS — F1994 Other psychoactive substance use, unspecified with psychoactive substance-induced mood disorder: Secondary | ICD-10-CM | POA: Diagnosis present

## 2016-03-11 DIAGNOSIS — F418 Other specified anxiety disorders: Secondary | ICD-10-CM | POA: Insufficient documentation

## 2016-03-11 DIAGNOSIS — F329 Major depressive disorder, single episode, unspecified: Secondary | ICD-10-CM

## 2016-03-11 DIAGNOSIS — F1190 Opioid use, unspecified, uncomplicated: Secondary | ICD-10-CM

## 2016-03-11 DIAGNOSIS — F17210 Nicotine dependence, cigarettes, uncomplicated: Secondary | ICD-10-CM | POA: Diagnosis not present

## 2016-03-11 LAB — CBC
HCT: 36.6 % (ref 36.0–46.0)
Hemoglobin: 11.8 g/dL — ABNORMAL LOW (ref 12.0–15.0)
MCH: 26.5 pg (ref 26.0–34.0)
MCHC: 32.2 g/dL (ref 30.0–36.0)
MCV: 82.2 fL (ref 78.0–100.0)
PLATELETS: 253 10*3/uL (ref 150–400)
RBC: 4.45 MIL/uL (ref 3.87–5.11)
RDW: 16.2 % — AB (ref 11.5–15.5)
WBC: 13.4 10*3/uL — AB (ref 4.0–10.5)

## 2016-03-11 LAB — COMPREHENSIVE METABOLIC PANEL
ALBUMIN: 4.3 g/dL (ref 3.5–5.0)
ALT: 14 U/L (ref 14–54)
ANION GAP: 4 — AB (ref 5–15)
AST: 18 U/L (ref 15–41)
Alkaline Phosphatase: 70 U/L (ref 38–126)
BILIRUBIN TOTAL: 0.3 mg/dL (ref 0.3–1.2)
BUN: 16 mg/dL (ref 6–20)
CHLORIDE: 109 mmol/L (ref 101–111)
CO2: 25 mmol/L (ref 22–32)
Calcium: 8.5 mg/dL — ABNORMAL LOW (ref 8.9–10.3)
Creatinine, Ser: 0.79 mg/dL (ref 0.44–1.00)
GFR calc Af Amer: >60 mL/min (ref >60)
Glucose, Bld: 107 mg/dL — ABNORMAL HIGH (ref 65–99)
POTASSIUM: 3.2 mmol/L — AB (ref 3.5–5.1)
Sodium: 138 mmol/L (ref 135–145)
TOTAL PROTEIN: 7.3 g/dL (ref 6.5–8.1)

## 2016-03-11 LAB — ETHANOL

## 2016-03-11 LAB — ACETAMINOPHEN LEVEL

## 2016-03-11 LAB — RAPID URINE DRUG SCREEN, HOSP PERFORMED
AMPHETAMINES: NOT DETECTED
BENZODIAZEPINES: POSITIVE — AB
Barbiturates: NOT DETECTED
COCAINE: POSITIVE — AB
OPIATES: POSITIVE — AB
Tetrahydrocannabinol: NOT DETECTED

## 2016-03-11 LAB — SALICYLATE LEVEL

## 2016-03-11 LAB — PREGNANCY, URINE: PREG TEST UR: NEGATIVE

## 2016-03-11 MED ORDER — LORAZEPAM 1 MG PO TABS
1.0000 mg | ORAL_TABLET | Freq: Three times a day (TID) | ORAL | Status: DC | PRN
  Administered 2016-03-12 (×2): 1 mg via ORAL
  Filled 2016-03-11 (×2): qty 1

## 2016-03-11 MED ORDER — ACETAMINOPHEN 325 MG PO TABS: 650.0000 mg | ORAL_TABLET | ORAL | Status: DC | PRN

## 2016-03-11 MED ORDER — IBUPROFEN 400 MG PO TABS: 600.0000 mg | ORAL_TABLET | Freq: Three times a day (TID) | ORAL | Status: DC | PRN

## 2016-03-11 MED ORDER — ONDANSETRON HCL 4 MG PO TABS
4.0000 mg | ORAL_TABLET | Freq: Three times a day (TID) | ORAL | Status: DC | PRN
  Administered 2016-03-12: 4 mg via ORAL
  Filled 2016-03-11: qty 1
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
## 2016-03-11 NOTE — ED Notes (Signed)
Mental health attempted to perform assessment on pt and pt was too sedated to stay up and answer questions

## 2016-03-11 NOTE — ED Notes (Signed)
Pt given crackers, peanut butter and and water
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
## 2016-03-12 ENCOUNTER — Inpatient Hospital Stay: Admission: RE | Admit: 2016-03-12 | Payer: Medicaid Other | Source: Intra-hospital | Admitting: Psychiatry

## 2016-03-12 DIAGNOSIS — F1920 Other psychoactive substance dependence, uncomplicated: Secondary | ICD-10-CM | POA: Diagnosis not present

## 2016-03-12 DIAGNOSIS — F1994 Other psychoactive substance use, unspecified with psychoactive substance-induced mood disorder: Secondary | ICD-10-CM | POA: Diagnosis present

## 2016-03-12 DIAGNOSIS — F1190 Opioid use, unspecified, uncomplicated: Secondary | ICD-10-CM | POA: Insufficient documentation

## 2016-03-12 DIAGNOSIS — F1110 Opioid abuse, uncomplicated: Secondary | ICD-10-CM

## 2016-03-12 DIAGNOSIS — F1410 Cocaine abuse, uncomplicated: Secondary | ICD-10-CM | POA: Insufficient documentation

## 2016-03-12 DIAGNOSIS — R45851 Suicidal ideations: Secondary | ICD-10-CM

## 2016-03-12 NOTE — ED Notes (Signed)
TTS consult in process. 
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
## 2016-03-12 NOTE — ED Notes (Signed)
Pt given breakfast meal tray. 

## 2016-03-12 NOTE — ED Notes (Signed)
[facility] Line busy.

## 2016-03-12 NOTE — Progress Notes (Signed)
Per [employer] CorporationWhithrow, DNP Pt. Is to bee seen for psych consult. Kynslei Art K. Sherlon HandingHarris, LCAS-A, LPC-A, [facility]  Counselor 03/12/2016 10:13 AM

## 2016-03-12 NOTE — Progress Notes (Signed)
[facility] [place] [facility] Dr. Ethlyn DanielsMunov has accepted pt for admission to behavioral health unit. Rn to RN report number is [phone]. [facility]. Pt under IVC.

## 2016-03-12 NOTE — ED Notes (Signed)
Pt heard yelling and cursing on the phone and at sitter. Pt slammed the door shut. Security at bedside and [employer] notified. When I went into room to address pt's behavior, pt demanding to know why she was here and claimed that she was going to leave. Attempted to explain to pt that she was under IVC and did not have the option to leave at this time. Stated that it would be [facility] to [facility] if she was deemed safe to leave. Pt became extremely agitated, throwing food tray on the floor. Pt yelling at this RN and sitter stating "nobody has come in here and checked on me all night. You guys have kept me here in a bed without telling me anything. You are a bitch. She is a Research officer, political partybitch Psychiatrist(sitter) and y'all ain't doing shit for me". Pt demanding methadone stating she has been on an methadone for 5 years. Attempted to explain that we don't have methadone. Pt stood up in a threatening manner and attempted to get in this RN's face, throwing her hands in my face. Security stepped in. No physical contact made. [employer] arrived. Pt sat back down in bed. Pt finally calmed down. After pt calmed down, I was able to address pt's concerns. Pt reports being in pain and extremely anxious. Offered Tylenol or Motrin, Ativan, and Zofran. Pt requesting Ativan and Zofran. States Tylenol and Motrin will not help. Sheriff is now at the bedside. Pt has RT leg in restraints placed by sheriff. Pt calm at this time.

## 2016-03-12 NOTE — BH Assessment (Signed)
Tele Assessment Note   Lynn Marshall is an 35 y.o. female, Caucasian, Single who presents to [facility] ED under IVC taken out by her family due to pt's "threats to harm them and herself." Police state they were called to the pt's home for "disturbance" related to pt and her mother arguing. Pt states she is on methadone for hx heroin addiction. States she relapsed this morning; "I did some heroin after being clean for 4 years." Pt states she was evaluated by a Psychiatrist in [place] that's "trying to get me into a place."  Patient primary concern is of substance abuse and depression. Patient states that she currently lives with boyfriend and x 3 children. Patient states that she has had a decline in sleep with 2 or less hours of sleep per night. Patient denies history of psychotic symptoms. During assessment pt. Stated that she would like to have help with mental health  medications and continuation of methadone treatment.  Patient denies curent SI/HI and history of, but states that she does have cutting behaviors when she is angry. Pt. Denies AVH or history of. Patient states that she has been seen inpatient for psychiatric care in 2017, 2016, and 2013 with [facility] and [facility] for depression and substance abuse. Patient states that she has been seen outpatient at [facility] in [place] for methadone treatment for x 4 years. Patient states that she is also [facility] for [facility] care. Patient acknowledges history of S.A. With cocaine, age of first use at 52 with 71 $ daily for eras with last use on 03/11/19 for 80$ worth. Patient states that she has history of opiate use via Heroine beginning at unknown age with 6$ worth daily and last use on 03/10/16 for 80$ worth.   Patient is dressed in scrubs and is alert and oriented x4. Patient speech was within normal limits and motor behavior appeared normal. Patient thought process is coherent. Patient   does not appear to be responding to internal stimuli. Patient was cooperative throughout the assessment and states that  she is not agreeable to inpatient psychiatric treatment.   Diagnosis: Major Depressive Disorder, Opiate Use Disorder, Severe; Cocaine Use Disorder, Severe  Past Medical History:  Past Medical History  Diagnosis Date  . Depression   . Anxiety   . PTSD (post-traumatic stress disorder)   . ADHD (attention deficit hyperactivity disorder)   . Bipolar 1 disorder (HCC)   . Heroin addiction [facility])     Past Surgical History  Procedure Laterality Date  . Cesarean section    . Tonsillectomy      Family History: History reviewed. No pertinent family history.  Social History:  reports that she has been smoking Cigarettes.  She has been smoking about 0.00 packs per day. She has never used smokeless tobacco. She reports that she drinks alcohol. She reports that she uses illicit drugs (IV, Marijuana, and Cocaine).  Additional Social History:  Alcohol / Drug Use Pain Medications: SEE MAR Prescriptions: SEE MAR Over the Counter: SEE MAR History of alcohol / drug use?: Yes Longest period of sobriety (when/how long): unspecified Negative Consequences of Use: Financial, Legal, Personal relationships Withdrawal Symptoms: Patient aware of relationship between substance abuse and physical/medical complications Substance #1 Name of Substance 1: Opiates/ Heroine 1 - Age of First Use: unspecified 1 - Amount (size/oz): 80$ worth 1 - Frequency: daily 1 - Duration: years 1 - Last Use / Amount: 03/10/16 80$ worth  Substance #2  Name of Substance 2: Cocaine 2 - Age of First Use: 15 2 - Amount (size/oz): 80$ worth 2 - Frequency: unspecified 2 - Duration: years 2 - Last Use / Amount: 03/10/16  CIWA: CIWA-Ar BP: 105/57 mmHg Pulse Rate: 60 COWS:    PATIENT STRENGTHS: (choose at least two) Average or above average intelligence Capable of independent living  Allergies: No Known  Allergies  Home Medications:  (Not in a hospital admission)  OB/GYN Status:  Patient's last menstrual period was 03/09/2016.  General Assessment Data Location of Assessment: [facility] TTS Assessment: In system Is this a Tele or Face-to-Face Assessment?: Tele Assessment Is this an Initial Assessment or a Re-assessment for this encounter?: Initial Assessment Marital status: Single Maiden name:  (n/a) Is patient pregnant?: No Pregnancy Status: No Living Arrangements: Spouse/significant other Can pt return to current living arrangement?: Yes Admission Status: Involuntary Is patient capable of signing voluntary admission?: Yes Referral Source: Self/Family/Friend Insurance type: Medicaid     Crisis Care Plan Living Arrangements: Spouse/significant other Name of Psychiatrist: Dr. Haynes DageAgappa [facility] of Therapist[facility]  Education Status Is patient currently in school?: No Current Grade: n/a Highest grade of school patient has completed: 7 Name of school: n/a Contact person: none given  Risk to self with the past 6 months Suicidal Ideation: No Has patient been a risk to self within the past 6 months prior to admission? : No Suicidal Intent: No Has patient had any suicidal intent within the past 6 months prior to admission? : No Is patient at risk for suicide?: Yes (via drug induced behaviors) Suicidal Plan?: No Has patient had any suicidal plan within the past 6 months prior to admission? : No Access to Means: No What has been your use of drugs/alcohol within the last 12 months?: heroine and cocaine Previous Attempts/Gestures: No How many times?: 0 Other Self Harm Risks: none noted Triggers for Past Attempts: None known Intentional Self Injurious Behavior: Cutting Comment - Self Injurious Behavior: pt states cuts self when mad Family Suicide History: No Recent stressful life event(s): Turmoil (Comment) Persecutory voices/beliefs?: No Depression: Yes Depression Symptoms:  Despondent, Insomnia, Tearfulness, Isolating, Fatigue, Guilt, Loss of interest in usual pleasures, Feeling worthless/self pity, Feeling angry/irritable Substance abuse history and/or treatment for substance abuse?: Yes Suicide prevention information given to non-admitted patients: Yes  Risk to Others within the past 6 months Homicidal Ideation: No Does patient have any lifetime risk of violence toward others beyond the six months prior to admission? : No Thoughts of Harm to Others: No Current Homicidal Intent: No Current Homicidal Plan: No Access to Homicidal Means: No Identified Victim: none History of harm to others?: No Assessment of Violence: None Noted Violent Behavior Description: none noted Does patient have access to weapons?: No Criminal Charges Pending?: No Does patient have a court date: No Is patient on probation?: No  Psychosis Hallucinations: None noted Delusions: None noted  Mental Status Report Appearance/Hygiene: In scrubs Eye Contact: Poor Motor Activity: Agitation Speech: Rapid, Pressured Level of Consciousness: Alert Mood: Depressed, Anxious Affect: Anxious, Depressed Anxiety Level: Panic Attacks Panic attack frequency: daily Most recent panic attack: 03/11/19 Thought Processes: Coherent, Relevant Judgement: Impaired Orientation: Person, Place, Time, Situation, Appropriate for developmental age Obsessive Compulsive Thoughts/Behaviors: None  Cognitive Functioning Concentration: Normal Memory: Recent Intact, Remote Intact IQ: Average Insight: Poor Impulse Control: Poor Appetite: Fair Weight Loss: 0 Weight Gain: 0 Sleep: Decreased Total Hours of Sleep: 2 Vegetative Symptoms: None  ADLScreening [facility] Assessment Services) Patient's cognitive ability adequate to  safely complete daily activities?: Yes Patient able to express need for assistance with ADLs?: Yes Independently performs ADLs?: Yes (appropriate for developmental age)  Prior Inpatient  Therapy Prior Inpatient Therapy: Yes Prior Therapy Dates: 2017, 2016, 2013 Prior Therapy Facilty/Provider(s): [facility] Reason for Treatment: depression/ S.A.  Prior Outpatient Therapy Prior Outpatient Therapy: Yes Prior Therapy Dates: current Prior Therapy Facilty/Provider(s):  [facility] and [facility] Reason for Treatment: S.A. and depression Does patient have an ACCT team?: No Does patient have Intensive In-House Services?  : No Does patient have [facility] services? : No Does patient have P4CC services?: No  ADL Screening (condition at time of admission) Patient's cognitive ability adequate to safely complete daily activities?: Yes Is the patient deaf or have difficulty hearing?: No Does the patient have difficulty seeing, even when wearing glasses/contacts?: No Does the patient have difficulty concentrating, remembering, or making decisions?: No Patient able to express need for assistance with ADLs?: Yes Does the patient have difficulty dressing or bathing?: No Independently performs ADLs?: Yes (appropriate for developmental age) Does the patient have difficulty walking or climbing stairs?: No Weakness of Legs: None Weakness of Arms/Hands: None  Home Assistive Devices/Equipment Home Assistive Devices/Equipment: None    Abuse/Neglect Assessment (Assessment to be complete while patient is alone) Physical Abuse: Denies Verbal Abuse: Denies Sexual Abuse: Denies Exploitation of patient/patient's resources: Denies Self-Neglect: Denies Values / Beliefs Cultural Requests During Hospitalization: None Spiritual Requests During Hospitalization: None   Advance Directives (For Healthcare) Does patient have an advance directive?: No Would patient like information on creating an advanced directive?: No - patient declined information Nutrition Screen- [facility] Patient's home diet: Regular  Additional Information 1:1 In Past 12 Months?: No CIRT Risk:  No Elopement Risk: No Does patient have medical clearance?: Yes     Disposition: Pending psych consult, psych consult placed. Whithrow , DNP to see Disposition Initial Assessment Completed for this Encounter: Yes Disposition of Patient: Other dispositions (TBD upon consult with extender)  Hipolito Bayley 03/12/2016 8:01 AM

## 2016-03-12 NOTE — ED Provider Notes (Signed)
Pt accepted to [facility] Dr. Ethlyn DanielsMunov. Will transfer stable.   Samuel JesterKathleen Javontae Marlette, DO 03/12/16 1636

## 2016-03-12 NOTE — Progress Notes (Signed)
Pt was seen for assessment, completion pending consult with extender. Lynn Marshall, LCAS-A, LPC-A, [facility]  Counselor 03/12/2016 8:17 AM

## 2016-03-12 NOTE — [facility] Counselor (Signed)
Spoke with nurse at [facility], Will notify pt. Nurse to order cart for tele-psych. Ulla Mckiernan K. Sherlon HandingHarris, LCAS-A, LPC-A, [facility]  Counselor 03/12/2016 7:32 AM

## 2016-03-12 NOTE — Progress Notes (Addendum)
Seeking inpatient psychiatric treatment on pt's behalf. [facility] upon appropriate bed availability as well.  [facility]  Ilean SkillMeghan [place], MSW, LCSW Clinical Social Work, Disposition  03/12/2016 [phone]  [facility] called back to state referral is being reviewed. Requested copies of IVC paperwork. Also asked status of methadone maintenance therapy mentioned by pt per assessment. Spoke with pt via ED phone. Pt states she has been on methodone for 4 years managed by [facility]. However, she states she last took methadone 3 days ago "because she had a heroin relapse." CSW discussed with her that many inpatient behavioral health hospitals (including [facility] may not be able to manage methadone administration during inpatient stay. Pt upset but understanding.
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
## 2016-03-18 ENCOUNTER — Emergency Department (HOSPITAL_COMMUNITY)
Admission: EM | Admit: 2016-03-18 | Discharge: 2016-03-19 | Disposition: A | Discharge status: Home / Self Care | Payer: No Typology Code available for payment source | Attending: Emergency Medicine | Admitting: Emergency Medicine

## 2016-03-18 ENCOUNTER — Emergency Department (HOSPITAL_COMMUNITY): Payer: No Typology Code available for payment source

## 2016-03-18 ENCOUNTER — Encounter (HOSPITAL_COMMUNITY): Payer: Self-pay | Admitting: *Deleted

## 2016-03-18 DIAGNOSIS — S70311A Abrasion, right thigh, initial encounter: Secondary | ICD-10-CM | POA: Diagnosis not present

## 2016-03-18 DIAGNOSIS — Y92410 Unspecified street and highway as the place of occurrence of the external cause: Secondary | ICD-10-CM | POA: Insufficient documentation

## 2016-03-18 DIAGNOSIS — S70211A Abrasion, right hip, initial encounter: Secondary | ICD-10-CM | POA: Insufficient documentation

## 2016-03-18 DIAGNOSIS — R4182 Altered mental status, unspecified: Secondary | ICD-10-CM | POA: Insufficient documentation

## 2016-03-18 DIAGNOSIS — M549 Dorsalgia, unspecified: Secondary | ICD-10-CM | POA: Diagnosis not present

## 2016-03-18 DIAGNOSIS — Y939 Activity, unspecified: Secondary | ICD-10-CM | POA: Diagnosis not present

## 2016-03-18 DIAGNOSIS — T1490XA Injury, unspecified, initial encounter: Secondary | ICD-10-CM

## 2016-03-18 DIAGNOSIS — F319 Bipolar disorder, unspecified: Secondary | ICD-10-CM | POA: Diagnosis not present

## 2016-03-18 DIAGNOSIS — R102 Pelvic and perineal pain: Secondary | ICD-10-CM | POA: Diagnosis not present

## 2016-03-18 DIAGNOSIS — F1910 Other psychoactive substance abuse, uncomplicated: Secondary | ICD-10-CM

## 2016-03-18 DIAGNOSIS — F17210 Nicotine dependence, cigarettes, uncomplicated: Secondary | ICD-10-CM | POA: Insufficient documentation

## 2016-03-18 DIAGNOSIS — S80811A Abrasion, right lower leg, initial encounter: Secondary | ICD-10-CM | POA: Diagnosis not present

## 2016-03-18 DIAGNOSIS — S30811A Abrasion of abdominal wall, initial encounter: Secondary | ICD-10-CM | POA: Diagnosis not present

## 2016-03-18 DIAGNOSIS — T07XXXA Unspecified multiple injuries, initial encounter: Secondary | ICD-10-CM

## 2016-03-18 DIAGNOSIS — Y999 Unspecified external cause status: Secondary | ICD-10-CM | POA: Insufficient documentation

## 2016-03-18 DIAGNOSIS — S80812A Abrasion, left lower leg, initial encounter: Secondary | ICD-10-CM | POA: Diagnosis present

## 2016-03-18 HISTORY — DX: Bipolar disorder, unspecified: F31.9

## 2016-03-18 HISTORY — DX: Other psychoactive substance abuse, uncomplicated: F19.10

## 2016-03-18 HISTORY — DX: Developmental disorder of scholastic skills, unspecified: F81.9

## 2016-03-18 LAB — TYPE AND SCREEN
ABO/RH(D): AB POS
Antibody Screen: NEGATIVE

## 2016-03-18 LAB — COMPREHENSIVE METABOLIC PANEL
ALT: 18 U/L (ref 14–54)
AST: 24 U/L (ref 15–41)
Albumin: 4.1 g/dL (ref 3.5–5.0)
Alkaline Phosphatase: 79 U/L (ref 38–126)
Anion gap: 7 (ref 5–15)
BUN: 15 mg/dL (ref 6–20)
CO2: 26 mmol/L (ref 22–32)
Calcium: 9.3 mg/dL (ref 8.9–10.3)
Chloride: 105 mmol/L (ref 101–111)
Creatinine, Ser: 0.73 mg/dL (ref 0.44–1.00)
GFR calc Af Amer: >60 mL/min (ref >60)
GFR calc non Af Amer: >60 mL/min (ref >60)
Glucose, Bld: 96 mg/dL (ref 65–99)
Potassium: 4.3 mmol/L (ref 3.5–5.1)
Sodium: 138 mmol/L (ref 135–145)
Total Bilirubin: 0.6 mg/dL (ref 0.3–1.2)
Total Protein: 7.4 g/dL (ref 6.5–8.1)

## 2016-03-18 LAB — CBC WITH DIFFERENTIAL/PLATELET
Basophils Absolute: 0 10*3/uL (ref 0.0–0.1)
Basophils Relative: 0 %
Eosinophils Absolute: 0.1 10*3/uL (ref 0.0–0.7)
Eosinophils Relative: 1 %
HCT: 39.6 % (ref 36.0–46.0)
Hemoglobin: 12.7 g/dL (ref 12.0–15.0)
Lymphocytes Relative: 28 %
Lymphs Abs: 3 10*3/uL (ref 0.7–4.0)
MCH: 26.2 pg (ref 26.0–34.0)
MCHC: 32.1 g/dL (ref 30.0–36.0)
MCV: 81.8 fL (ref 78.0–100.0)
Monocytes Absolute: 0.7 10*3/uL (ref 0.1–1.0)
Monocytes Relative: 6 %
Neutro Abs: 6.8 10*3/uL (ref 1.7–7.7)
Neutrophils Relative %: 65 %
Platelets: 286 10*3/uL (ref 150–400)
RBC: 4.84 MIL/uL (ref 3.87–5.11)
RDW: 16 % — ABNORMAL HIGH (ref 11.5–15.5)
WBC: 10.6 10*3/uL — ABNORMAL HIGH (ref 4.0–10.5)

## 2016-03-18 LAB — I-STAT BETA HCG BLOOD, ED (MC, WL, AP ONLY): I-stat hCG, quantitative: <5 m[IU]/mL (ref <5)

## 2016-03-18 LAB — ABO/RH: ABO/RH(D): AB POS

## 2016-03-18 LAB — I-STAT CG4 LACTIC ACID, ED
Lactic Acid, Venous: 1.36 mmol/L (ref 0.5–1.9)
Lactic Acid, Venous: 0.6 mmol/L (ref 0.5–1.9)

## 2016-03-18 MED ORDER — SODIUM CHLORIDE 0.9 % IV BOLUS (SEPSIS)
1000.0000 mL | Freq: Once | INTRAVENOUS | Status: AC
  Administered 2016-03-18: 1000 mL via INTRAVENOUS

## 2016-03-18 MED ORDER — QUETIAPINE FUMARATE 200 MG PO TABS
200.0000 mg | ORAL_TABLET | Freq: Every day | ORAL | Status: DC
  Administered 2016-03-19: 200 mg via ORAL
  Filled 2016-03-18: qty 1

## 2016-03-18 MED ORDER — LORAZEPAM 2 MG/ML IJ SOLN
INTRAMUSCULAR | Status: AC
  Filled 2016-03-18: qty 1

## 2016-03-18 MED ORDER — LORAZEPAM 2 MG/ML IJ SOLN
0.5000 mg | Freq: Once | INTRAMUSCULAR | Status: AC
  Administered 2016-03-18: 0.5 mg via INTRAVENOUS

## 2016-03-18 MED ORDER — LOPERAMIDE HCL 2 MG PO CAPS: 2.0000 mg | ORAL_CAPSULE | ORAL | Status: DC | PRN

## 2016-03-18 MED ORDER — HYDROMORPHONE HCL 1 MG/ML IJ SOLN
1.0000 mg | Freq: Once | INTRAMUSCULAR | Status: AC
  Administered 2016-03-18: 1 mg via INTRAVENOUS

## 2016-03-18 MED ORDER — FENTANYL CITRATE (PF) 100 MCG/2ML IJ SOLN: 50.0000 ug | INTRAMUSCULAR | Status: DC | PRN

## 2016-03-18 MED ORDER — IOPAMIDOL (ISOVUE-300) INJECTION 61%
INTRAVENOUS | Status: AC
  Administered 2016-03-18: 100 mL
  Filled 2016-03-18: qty 100

## 2016-03-18 MED ORDER — OXYCODONE-ACETAMINOPHEN 5-325 MG PO TABS
1.0000 | ORAL_TABLET | Freq: Four times a day (QID) | ORAL | Status: DC | PRN
  Administered 2016-03-18 – 2016-03-19 (×3): 1 via ORAL
  Filled 2016-03-18 (×3): qty 1

## 2016-03-18 MED ORDER — GABAPENTIN 600 MG PO TABS
600.0000 mg | ORAL_TABLET | Freq: Three times a day (TID) | ORAL | Status: DC
  Administered 2016-03-19 (×2): 600 mg via ORAL
  Filled 2016-03-18 (×3): qty 1

## 2016-03-18 MED ORDER — FENTANYL CITRATE (PF) 100 MCG/2ML IJ SOLN
INTRAMUSCULAR | Status: AC | PRN
  Administered 2016-03-18: 100 ug via INTRAVENOUS

## 2016-03-18 MED ORDER — HYDROXYZINE PAMOATE 25 MG PO CAPS
25.0000 mg | ORAL_CAPSULE | Freq: Four times a day (QID) | ORAL | Status: DC | PRN
  Filled 2016-03-18: qty 1

## 2016-03-18 MED ORDER — NICOTINE 21 MG/24HR TD PT24
21.0000 mg | MEDICATED_PATCH | Freq: Every day | TRANSDERMAL | Status: DC
  Administered 2016-03-18 – 2016-03-19 (×2): 21 mg via TRANSDERMAL
  Filled 2016-03-18 (×2): qty 1

## 2016-03-18 MED ORDER — IBUPROFEN 400 MG PO TABS
600.0000 mg | ORAL_TABLET | Freq: Once | ORAL | Status: AC
  Administered 2016-03-18: 600 mg via ORAL

## 2016-03-18 MED ORDER — CLONIDINE HCL 0.1 MG PO TABS: 0.1000 mg | ORAL_TABLET | Freq: Three times a day (TID) | ORAL | Status: DC | PRN

## 2016-03-18 MED ORDER — HYDROMORPHONE HCL 1 MG/ML IJ SOLN
INTRAMUSCULAR | Status: AC
  Filled 2016-03-18: qty 1

## 2016-03-18 MED ORDER — ONDANSETRON 4 MG PO TBDP: 4.0000 mg | ORAL_TABLET | Freq: Three times a day (TID) | ORAL | Status: DC | PRN

## 2016-03-18 MED ORDER — DICYCLOMINE HCL 10 MG PO CAPS: 10.0000 mg | ORAL_CAPSULE | Freq: Two times a day (BID) | ORAL | Status: DC | PRN

## 2016-03-18 MED ORDER — ATOMOXETINE HCL 60 MG PO CAPS
60.0000 mg | ORAL_CAPSULE | Freq: Every day | ORAL | Status: DC
  Administered 2016-03-19: 60 mg via ORAL
  Filled 2016-03-18: qty 1

## 2016-03-18 NOTE — ED Notes (Signed)
Assisted pt to call family per pt request
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
## 2016-03-18 NOTE — Progress Notes (Signed)
   03/18/16 1000  Clinical Encounter Type  Visited With Health care provider  Visit Type Initial;ED;Trauma  Referral From Nurse  [facility] reported to ED for Level 1 Trauma:  Spiritual support available as needed.  Page [facility] [phone]. Erline Levine 10:42 AM

## 2016-03-18 NOTE — ED Notes (Signed)
Pts boyfriend called & informed this RN that IVC papers have been taken out on the pt & that [facility] [employer] dept will be signing the papers & serving the pt ASAP, no information was provided to the caller at anytime, MD informed of the call

## 2016-03-18 NOTE — Progress Notes (Signed)
Orthopedic Tech Progress Note Patient Details:  Lynn Marshall 1980/11/02 [id number]  Patient ID: Lynn Marshall, female   DOB: September 08, 1980, 35 y.o.   MRN: [id number]   Lynn Marshall 03/18/2016, 11:02 AM Made level 2 trauma visit
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
## 2016-03-18 NOTE — ED Notes (Signed)
[facility] Sheriff served pt IVC papers, no sharps lunch tray ordered

## 2016-03-18 NOTE — ED Notes (Signed)
Pt called out stating that the knee immobilizer was irritating her skin, requesting for the immobilizer to be removed. Pt refusing to wear knee immobilizer at this time.

## 2016-03-18 NOTE — ED Notes (Signed)
Sitter at the bedside.

## 2016-03-18 NOTE — ED Notes (Signed)
Detective Hardy @ bedside, pt returned from X ray, pt A&O x4, requesting to sit up, pt placed @ 45 degree angle, A&O x4, pt to transport to CT

## 2016-03-18 NOTE — ED Notes (Signed)
Pt tearful, pt denies SI, pt aware of IVC, pt having TTS completed at this time, pts boyfriend given pamphlet with guidelines for POD C, security at bedside informing pt that she cannot leave at this time

## 2016-03-18 NOTE — ED Notes (Signed)
No Estate manager/land agent ordered

## 2016-03-18 NOTE — ED Notes (Signed)
c-collar removed
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
## 2016-03-19 DIAGNOSIS — S80812A Abrasion, left lower leg, initial encounter: Secondary | ICD-10-CM | POA: Diagnosis not present

## 2016-03-19 NOTE — ED Notes (Signed)
Pt given change of clothes and soap for shower. Pt's linens changed. Sitter with pt for shower
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
## 2016-03-19 NOTE — ED Notes (Signed)
Pt states she is not suicidal. Pt agrees to find rehab facility. Pt has contacted significant other to drive her home.

## 2016-03-19 NOTE — ED Notes (Signed)
Notified pt of new d/c plans. MD repealed IVC papers. Pt aware. Gave pt outpatient rehab facility resources, pt agreeable to go home and find rehab facility right away. Pt calling for ride home.

## 2016-03-19 NOTE — Discharge Instructions (Signed)
Take ibuprofen 600 mg every 6 hours as needed for pain.  Follow-up with outpatient addiction treatment as recommended by behavioral health.

## 2016-03-19 NOTE — ED Notes (Addendum)
This RN spoke with [facility] therapist, Merry Proud. Plan for today: Pt can come to [facility] as observation, but cannot be IVCed. From [facility], pt could go to RTS. IVC would have to be repealed by EPD. If EPD does not feel that pt is appropriate for IVC being repealed, pt will stay in ED and wait for inpt placement. Notified Dr. Judd Lien, awaiting further assessment. Pt sleeping in bed at this time with sitter at bedside

## 2016-03-19 NOTE — ED Notes (Signed)
Lunch tray ordered for pt.

## 2016-03-19 NOTE — ED Notes (Signed)
Gave pt back her necklace from valuables.

## 2016-03-19 NOTE — ED Notes (Signed)
Pt given her home medications from pharmacy. Pt had no other belongings here at ED. Pt signed no harm contract.

## 2016-03-19 NOTE — ED Notes (Signed)
Gave patient some coffee  and a sprite to drink

## 2016-03-19 NOTE — Progress Notes (Signed)
CSW provided Patient's RN with substance abuse resources including outpatient treatment, intensive outpatient treatment (SAIOP), and residential substance abuse treatment centers. CSW also provided list of local methadone, suboxone, and buprenorphine treatment centers.          Lance Muss, LCSW [facility] ED/47M Clinical Social Worker [phone]

## 2016-03-19 NOTE — ED Notes (Signed)
Breakfast tray ordered 

## 2016-03-19 NOTE — ED Notes (Signed)
Pt's significant other [facility]. Pt has all belongings and denies SI/HI. Pt states she is done with drugs and has full intentions to become sober.

## 2016-03-19 NOTE — ED Notes (Signed)
Medications returned to pt, Primary RN Hope D. Aware, sitter remains with pt, disposition for pt to be discharged

## 2016-03-19 NOTE — ED Notes (Signed)
This RN spoke with pt and had encouraging talk with pt. Pt agreeable to going to rehab and emotional about wanting to be clean for her kids. RN offered comfort.

## 2016-03-19 NOTE — ED Notes (Signed)
This RN spoke with EDP, [place]. Plan is to keep pt in ED IVCed at this time and have [facility] fax her information out for inpatient treatment. Pt made aware

## 2016-03-20 ENCOUNTER — Encounter (HOSPITAL_COMMUNITY): Payer: Self-pay | Admitting: Emergency Medicine

## 2017-06-17 ENCOUNTER — Encounter (HOSPITAL_COMMUNITY): Payer: Self-pay

## 2017-06-17 ENCOUNTER — Emergency Department (HOSPITAL_COMMUNITY)
Admission: EM | Admit: 2017-06-17 | Discharge: 2017-06-17 | Disposition: A | Discharge status: Home / Self Care | Payer: Medicaid Other | Attending: Emergency Medicine | Admitting: Emergency Medicine

## 2017-06-17 DIAGNOSIS — T50901A Poisoning by unspecified drugs, medicaments and biological substances, accidental (unintentional), initial encounter: Secondary | ICD-10-CM | POA: Insufficient documentation

## 2017-06-17 DIAGNOSIS — F17210 Nicotine dependence, cigarettes, uncomplicated: Secondary | ICD-10-CM | POA: Diagnosis not present

## 2017-06-17 DIAGNOSIS — R410 Disorientation, unspecified: Secondary | ICD-10-CM | POA: Diagnosis present

## 2017-06-17 DIAGNOSIS — Z79899 Other long term (current) drug therapy: Secondary | ICD-10-CM | POA: Insufficient documentation

## 2017-06-17 DIAGNOSIS — Z7901 Long term (current) use of anticoagulants: Secondary | ICD-10-CM | POA: Diagnosis not present
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
## 2017-06-17 NOTE — ED Notes (Signed)
Snacks given with sprite

## 2017-06-17 NOTE — ED Notes (Signed)
Awaiting transport.

## 2017-06-17 NOTE — Discharge Instructions (Signed)
Consider drug rehab, if not for yourself, four your daughter's sake. The following are local Substance Abuse Treatment Programs  Intensive Outpatient Programs [facility]     [address]      [place], [place]                   [phone]       [facility] [address] [place], [place] [phone]  [facility]     (Inpatient and outpatient)     [address]           [phone]    [facility] [phone] (Suboxone and Methadone)  [address]      [place], [place] [address]      [phone]       [address] [place], [place] [phone]  [facility] (Outpatient/Inpatient, Chemical)    (insurance only) [phone]             [facility] (Groups & Residential) [place], [place] [phone]     [facility]     [address]     [place], [place]      [phone]       [facility] (for caregivers and family) [phone] [place], [place] [phone]      Residential Treatment Programs [facility]      [address], [place], [place] [address]  [phone]       [facility] [address] [place], [place] [address] [phone]  [facility]        [phone]       [facility] [phone]  [facility] ([facility]             [address]                                         [place], [place]                                                [phone] or [phone]                               [facility] [place] [address] [place], [id number] [phone]  [facility]    [address]      [place], [place]     [phone]       [facility] [address] [place], [place] [phone]  [facility]   [address]     [place], [place] [address]     [phone]      Admissions:  8am-3pm M-F  [facility] ([facility]) [address] [place], [place] [phone]  [facility]: Residential Program [phone](90 Days)   [place], [place]      [phone] or [phone]     [facility]: [facility] [place], [place] (Walk in Hours over the weekend or by referral)  [facility] [address] [place], [place], [place] [address] [phone]  Crisis Mobile: [facility]:  [phone] (for crisis response 24 hours a day) [facility] Hotline:  [phone] Outpatient Psychiatry and Counseling  [facility]: Mobile Crisis Management 24 hours:  [phone]  [facility] [employer] sliding scale fee and walk in schedule: M-F 8am-12pm/1pm-3pm [place], [place] [address] [address] [place], [place] [address] [phone]  [facility] ([facility])- new patient walk-in appointments available Monday - Friday 8am -3pm.          [address] [place], [place] [address] [phone] or crisis line- [facility]/ Intensive Outpatient Therapy Program [place], [place] [address] [place]      [phone] N. [place], [place] [address]                 [place]   [facility] [phone]. [address] [place], [place] [address]   [employer] [facility]          [address] Johnette Abraham  [place], [place] [address]       [phone]  [facility] [address], [place], [place] [address] [phone]  [facility]    [address] [place], [place] [address]     [place], [place] Joycelyn Man     [place]  [address]     [phone]       [facility] [place] [address]  [facility]     [address] [place], [place], [place], [place] [address] [place]     [address]     [place], [place] [address]     [phone]       [facility] [address] [place], [place] [address] [phone] Resources for Temporary Residential Assistance/Crisis [place] [facility]) M-F 8am-3pm   [address] [place], [place] [address]   [phone] Services include: laundry, barbering, support groups, case management, phone  & computer access, showers, AA/NA mtgs, mental health/substance abuse nurse, job skills class, disability information, VA assistance, spiritual classes, etc.   [facility]   [address], [place]     [place]              [employer] (women and children)       [place]. [place], [place] [address] [phone] [email or web address] for application and process Application Required  [facility]   [address]    [place] [address]     [phone]                    [place], [place] [address] [id number] application appt.) Application Required  [employer] (women only)    [place], [place]  [place]      Intake starts 6pm daily Need valid ID, SSC, & Police report [employer] [address] [place], [place] [id number] Application Required  [employer] (men only)     [place]      [place], [place]       [place] (Pregnant  women only) [address] [place], [place]  [facility]      [address]      [place], [place] [address]     [phone]             [facility] [address] [place], [place] 90 day commitment/SA/Application process  [facility](men only)     [address]     [place], [place]       Check-in at [facility] [address] [place], [place] [address] [phone] Men/Women/Women and Children must be there by 7 pm  [place], [place]

## 2017-06-17 NOTE — ED Notes (Signed)
Waiting on sheriff department to give patient a ride home.
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
## 2017-06-17 NOTE — ED Notes (Signed)
Report received 

## 2019-03-05 ENCOUNTER — Emergency Department (HOSPITAL_COMMUNITY)
Admission: EM | Admit: 2019-03-05 | Discharge: 2019-03-06 | Payer: Medicaid Other | Attending: Emergency Medicine | Admitting: Emergency Medicine

## 2019-03-05 ENCOUNTER — Encounter (HOSPITAL_COMMUNITY): Payer: Self-pay | Admitting: Emergency Medicine

## 2019-03-05 ENCOUNTER — Other Ambulatory Visit: Payer: Self-pay

## 2019-03-05 DIAGNOSIS — S62324A Displaced fracture of shaft of fourth metacarpal bone, right hand, initial encounter for closed fracture: Secondary | ICD-10-CM | POA: Diagnosis not present

## 2019-03-05 DIAGNOSIS — W108XXA Fall (on) (from) other stairs and steps, initial encounter: Secondary | ICD-10-CM | POA: Insufficient documentation

## 2019-03-05 DIAGNOSIS — S6991XA Unspecified injury of right wrist, hand and finger(s), initial encounter: Secondary | ICD-10-CM | POA: Diagnosis present

## 2019-03-05 DIAGNOSIS — Y9301 Activity, walking, marching and hiking: Secondary | ICD-10-CM | POA: Insufficient documentation

## 2019-03-05 DIAGNOSIS — F17210 Nicotine dependence, cigarettes, uncomplicated: Secondary | ICD-10-CM | POA: Insufficient documentation

## 2019-03-05 DIAGNOSIS — Y999 Unspecified external cause status: Secondary | ICD-10-CM | POA: Diagnosis not present

## 2019-03-05 DIAGNOSIS — Y929 Unspecified place or not applicable: Secondary | ICD-10-CM | POA: Diagnosis not present

## 2019-03-05 NOTE — ED Triage Notes (Signed)
Pt says that she tripped going up the stairs and caught her fingers on the railings. Pt with swelling and obvious deformity to right hand.

## 2019-03-06 ENCOUNTER — Emergency Department (HOSPITAL_COMMUNITY): Payer: Medicaid Other

## 2019-03-06 MED ORDER — HYDROCODONE-ACETAMINOPHEN 5-325 MG PO TABS: 1.0000 | ORAL_TABLET | ORAL | 0 refills | Status: DC | PRN

## 2019-03-06 MED ORDER — HYDROCODONE-ACETAMINOPHEN 5-325 MG PO TABS
1.0000 | ORAL_TABLET | Freq: Once | ORAL | Status: AC
  Administered 2019-03-06: 1 via ORAL
  Filled 2019-03-06: qty 1

## 2019-03-06 NOTE — Discharge Instructions (Addendum)
Your x-ray shows a broken bone in your hand.  This will likely need surgery to fix.  Wear the splint and take the pain medication as needed.  Keep the arm elevated and use ice.  Follow-up with a hand surgeon for an appointment this week.  Return to the ED with worsening pain, numbness, tingling, any other concerns.

## 2019-03-06 NOTE — ED Provider Notes (Signed)
[facility] Provider Note   CSN: [id number] Arrival date & time: 03/05/19  2336     History   Chief Complaint Chief Complaint  Patient presents with  . Hand Injury    HPI Lynn Marshall is a 38 y.o. female.     Patient from jail with right hand injury.  States she was try to go up a set of stairs when she fell striking the dorsal surface of her right hand on a metal step.  She denies hitting her head or losing consciousness or any other injury.  No head, neck, back, chest or abdominal pain.  Complains of diffuse right hand pain worse laterally.  She has difficulty moving her fingers but there is no numbness or tingling.  No breaks in the skin.  She denies any upper arm pain.  Did not take anything for pain did not apply ice.  Patient denies punching anyone or anything.  Deputy at bedside confirms patient was cleaning the jail pod by herself.  The history is provided by the patient.  Hand Injury Associated symptoms: no fever     Past Medical History:  Diagnosis Date  . ADHD (attention deficit hyperactivity disorder)   . Anxiety   . Bipolar 1 disorder (HCC)   . Depression   . Heroin addiction (HCC)   . IV drug abuse (HCC)   . 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 Manic depressive disorder (HCC)   . PTSD (post-traumatic stress disorder)     Patient Active Problem List   Diagnosis Date Noted  . Substance induced mood disorder (HCC) 03/12/2016  . Cocaine abuse (HCC)   . Heroin use   . Opioid type dependence, continuous (HCC) 01/04/2015  . Polysubstance dependence including opioid drug with daily use (HCC) 01/04/2015  . PTSD (post-traumatic stress disorder) 01/04/2015  . Polysubstance abuse (HCC) 01/03/2015    Past Surgical History:  Procedure Laterality Date  . CESAREAN SECTION    . TONSILLECTOMY       OB History    Gravida  1   Para  0   Term  0   Preterm  0   AB  0   Living        SAB  0   TAB  0   Ectopic  0   Multiple      Live Births               Home Medications    Prior to Admission medications   Medication Sig Start Date End Date Taking? Authorizing Provider  gabapentin (NEURONTIN) 600 MG tablet Take 600 mg by mouth 3 (three) times daily.    [provider]  hydrOXYzine (VISTARIL) 25 MG capsule Take 25 mg by mouth every 6 (six) hours as needed for anxiety.    [provider]  QUEtiapine (SEROQUEL) 200 MG tablet Take 200 mg by mouth at bedtime.    [provider]    Family History No family history on file.  Social History Social History   Tobacco Use  . Smoking status: Current Every Day Smoker    Packs/day: 0.50    Types: Cigarettes  . Smokeless tobacco: Never Used  Substance Use Topics  . Alcohol use: No    Comment: occassionally  . Drug use: Not Currently    Types: Marijuana, Cocaine, IV    Comment: heroin; uses 65mg  of methadone      Allergies   Patient has no known allergies.  Review of Systems Review of Systems  Constitutional: Negative for activity change, appetite change and fever.  HENT: Negative for congestion.   Eyes: Negative for visual disturbance.  Respiratory: Negative for cough, chest tightness and shortness of breath.   Cardiovascular: Negative for chest pain.  Gastrointestinal: Negative for abdominal pain, nausea and vomiting.  Genitourinary: Negative for dysuria and hematuria.  Musculoskeletal: Positive for arthralgias and myalgias.  Skin: Negative for rash.  Neurological: Negative for dizziness, weakness and headaches.   all other systems are negative except as noted in the HPI and PMH.     Physical Exam Updated Vital Signs BP 119/80 (BP Location: Left Arm)   Pulse (!) 59   Temp 98 F (36.7 C) (Oral)   Resp 16   Ht 5\' 6"  (1.676 m)   Wt 86.2 kg   SpO2 99%   BMI 30.67 kg/m   Physical Exam Vitals signs and nursing note reviewed.  Constitutional:      General: She is not in acute  distress.    Appearance: Normal appearance. She is well-developed and normal weight. She is not ill-appearing.  HENT:     Head: Normocephalic and atraumatic.     Mouth/Throat:     Pharynx: No oropharyngeal exudate.  Eyes:     Conjunctiva/sclera: Conjunctivae normal.     Pupils: Pupils are equal, round, and reactive to light.  Neck:     Musculoskeletal: Normal range of motion and neck supple.     Comments: No meningismus. Cardiovascular:     Rate and Rhythm: Normal rate and regular rhythm.     Heart sounds: Normal heart sounds. No murmur.  Pulmonary:     Effort: Pulmonary effort is normal. No respiratory distress.     Breath sounds: Normal breath sounds.  Abdominal:     Palpations: Abdomen is soft.     Tenderness: There is no abdominal tenderness. There is no guarding or rebound.  Musculoskeletal:        General: Swelling, tenderness, deformity and signs of injury present.     Comments: Diffuse edema to dorsal right hand. Reduced ROM due to pain. Radial pulse intact. No snuffbox tenderness. No breaks in skin. Able to give thumbs up Some ulnar deviation of right fourth digit with reduced range of motion of all PIP, DIP and MCP joints.  Skin:    General: Skin is warm.     Capillary Refill: Capillary refill takes less than 2 seconds.  Neurological:     General: No focal deficit present.     Mental Status: She is alert and oriented to person, place, and time. Mental status is at baseline.     Cranial Nerves: No cranial nerve deficit.     Motor: No abnormal muscle tone.     Coordination: Coordination normal.     Comments: No ataxia on finger to nose bilaterally. No pronator drift. 5/5 strength throughout. CN 2-12 intact.Equal grip strength. Sensation intact.   Psychiatric:        Behavior: Behavior normal.      ED Treatments / Results  Labs (all labs ordered are listed, but only abnormal results are displayed) Labs Reviewed - No data to display  EKG None  Radiology Dg  Wrist Complete Right  Result Date: 03/06/2019 CLINICAL DATA:  38 y/o F; injury with swelling and obvious deformity. EXAM: RIGHT WRIST - COMPLETE 3+ VIEW; RIGHT HAND - COMPLETE 3+ VIEW COMPARISON:  None. FINDINGS: Right wrist: Acute oblique overriding and displaced fracture of the fourth metacarpal  distal diaphysis. No additional fracture or dislocation identified. Wrist joint is maintained. Right hand: Acute oblique overriding and displaced fracture of the fourth metacarpal distal diaphysis. No additional fracture or dislocation identified. IMPRESSION: 1. Acute oblique overriding and displaced fracture of the fourth metacarpal distal diaphysis. 2. No additional fracture or dislocation identified. Electronically Signed   By: Kristine Garbe M.D.   On: 03/06/2019 00:44   Dg Hand Complete Right  Result Date: 03/06/2019 CLINICAL DATA:  38 y/o F; injury with swelling and obvious deformity. EXAM: RIGHT WRIST - COMPLETE 3+ VIEW; RIGHT HAND - COMPLETE 3+ VIEW COMPARISON:  None. FINDINGS: Right wrist: Acute oblique overriding and displaced fracture of the fourth metacarpal distal diaphysis. No additional fracture or dislocation identified. Wrist joint is maintained. Right hand: Acute oblique overriding and displaced fracture of the fourth metacarpal distal diaphysis. No additional fracture or dislocation identified. IMPRESSION: 1. Acute oblique overriding and displaced fracture of the fourth metacarpal distal diaphysis. 2. No additional fracture or dislocation identified. Electronically Signed   By: Kristine Garbe M.D.   On: 03/06/2019 00:44    Procedures Procedures (including critical care time)  Medications Ordered in ED Medications  HYDROcodone-acetaminophen (NORCO/VICODIN) 5-325 MG per tablet 1 tablet (1 tablet Oral Given 03/06/19 0008)     Initial Impression / Assessment and Plan / ED Course  I have reviewed the triage vital signs and the nursing notes.  Pertinent labs & imaging  results that were available during my care of the patient were reviewed by me and considered in my medical decision making (see chart for details).       Hand injury from blunt trauma.  Neurovascular intact.  No open wounds.   X-ray shows displaced fourth metacarpal fracture.  No open wounds.  Patient given ulnar gutter splint, pain control, follow-up with hand surgery.  Discussed that this will likely need to have surgery.  Patient currently incarcerated.  [facility] staff states she may need to go to [facility] to see hand surgery. She will also be given [facility] follow-up information. History of narcotic abuse remotely.  Patient agrees to short course of opiates to be managed by jail staff as needed for pain given her fracture.  Return precautions discussed. Final Clinical Impressions(s) / ED Diagnoses   Final diagnoses:  Closed displaced fracture of shaft of fourth metacarpal bone of right hand, initial encounter    ED Discharge Orders    None       Ellarae Nevitt, Annie Main, MD 03/06/19 [phone]

## 2019-11-13 ENCOUNTER — Emergency Department (HOSPITAL_COMMUNITY)
Admission: EM | Admit: 2019-11-13 | Discharge: 2019-11-13 | Discharge status: Against Medical Advice | Payer: Medicaid Other | Attending: Emergency Medicine | Admitting: Emergency Medicine

## 2019-11-13 ENCOUNTER — Encounter (HOSPITAL_COMMUNITY): Payer: Self-pay | Admitting: *Deleted

## 2019-11-13 ENCOUNTER — Inpatient Hospital Stay (HOSPITAL_COMMUNITY): Admission: AD | Admit: 2019-11-13 | Payer: Medicaid Other | Source: Intra-hospital | Admitting: Psychiatry

## 2019-11-13 ENCOUNTER — Other Ambulatory Visit: Payer: Self-pay

## 2019-11-13 DIAGNOSIS — Z20822 Contact with and (suspected) exposure to covid-19: Secondary | ICD-10-CM | POA: Insufficient documentation

## 2019-11-13 DIAGNOSIS — F1120 Opioid dependence, uncomplicated: Secondary | ICD-10-CM | POA: Diagnosis not present

## 2019-11-13 DIAGNOSIS — Z79899 Other long term (current) drug therapy: Secondary | ICD-10-CM | POA: Diagnosis not present

## 2019-11-13 DIAGNOSIS — F32A Depression, unspecified: Secondary | ICD-10-CM

## 2019-11-13 DIAGNOSIS — F15259 Other stimulant dependence with stimulant-induced psychotic disorder, unspecified: Secondary | ICD-10-CM | POA: Diagnosis not present

## 2019-11-13 DIAGNOSIS — Z5329 Procedure and treatment not carried out because of patient's decision for other reasons: Secondary | ICD-10-CM | POA: Insufficient documentation

## 2019-11-13 DIAGNOSIS — F17210 Nicotine dependence, cigarettes, uncomplicated: Secondary | ICD-10-CM | POA: Insufficient documentation

## 2019-11-13 DIAGNOSIS — F332 Major depressive disorder, recurrent severe without psychotic features: Secondary | ICD-10-CM | POA: Insufficient documentation

## 2019-11-13 DIAGNOSIS — F329 Major depressive disorder, single episode, unspecified: Secondary | ICD-10-CM

## 2019-11-13 DIAGNOSIS — Z046 Encounter for general psychiatric examination, requested by authority: Secondary | ICD-10-CM | POA: Diagnosis present

## 2019-11-13 DIAGNOSIS — F1910 Other psychoactive substance abuse, uncomplicated: Secondary | ICD-10-CM

## 2019-11-13 LAB — COMPREHENSIVE METABOLIC PANEL
ALT: 17 U/L (ref 0–44)
AST: 20 U/L (ref 15–41)
Albumin: 3.9 g/dL (ref 3.5–5.0)
Alkaline Phosphatase: 85 U/L (ref 38–126)
Anion gap: 9 (ref 5–15)
BUN: 13 mg/dL (ref 6–20)
CO2: 25 mmol/L (ref 22–32)
Calcium: 8.7 mg/dL — ABNORMAL LOW (ref 8.9–10.3)
Chloride: 102 mmol/L (ref 98–111)
Creatinine, Ser: 0.6 mg/dL (ref 0.44–1.00)
GFR calc Af Amer: >60 mL/min (ref >60)
GFR calc non Af Amer: >60 mL/min (ref >60)
Glucose, Bld: 114 mg/dL — ABNORMAL HIGH (ref 70–99)
Potassium: 3.9 mmol/L (ref 3.5–5.1)
Sodium: 136 mmol/L (ref 135–145)
Total Bilirubin: 0.3 mg/dL (ref 0.3–1.2)
Total Protein: 8.1 g/dL (ref 6.5–8.1)

## 2019-11-13 LAB — CBC WITH DIFFERENTIAL/PLATELET
Abs Immature Granulocytes: 0.04 10*3/uL (ref 0.00–0.07)
Basophils Absolute: 0.1 10*3/uL (ref 0.0–0.1)
Basophils Relative: 1 %
Eosinophils Absolute: 0.1 10*3/uL (ref 0.0–0.5)
Eosinophils Relative: 1 %
HCT: 35.8 % — ABNORMAL LOW (ref 36.0–46.0)
Hemoglobin: 10.9 g/dL — ABNORMAL LOW (ref 12.0–15.0)
Immature Granulocytes: 0 %
Lymphocytes Relative: 33 %
Lymphs Abs: 3.4 10*3/uL (ref 0.7–4.0)
MCH: 24.1 pg — ABNORMAL LOW (ref 26.0–34.0)
MCHC: 30.4 g/dL (ref 30.0–36.0)
MCV: 79.2 fL — ABNORMAL LOW (ref 80.0–100.0)
Monocytes Absolute: 0.5 10*3/uL (ref 0.1–1.0)
Monocytes Relative: 5 %
Neutro Abs: 6.4 10*3/uL (ref 1.7–7.7)
Neutrophils Relative %: 60 %
Platelets: 412 10*3/uL — ABNORMAL HIGH (ref 150–400)
RBC: 4.52 MIL/uL (ref 3.87–5.11)
RDW: 17.9 % — ABNORMAL HIGH (ref 11.5–15.5)
WBC: 10.5 10*3/uL (ref 4.0–10.5)
nRBC: 0 % (ref 0.0–0.2)

## 2019-11-13 LAB — RAPID URINE DRUG SCREEN, HOSP PERFORMED
Amphetamines: POSITIVE — AB
Barbiturates: NOT DETECTED
Benzodiazepines: NOT DETECTED
Cocaine: NOT DETECTED
Opiates: POSITIVE — AB
Tetrahydrocannabinol: POSITIVE — AB

## 2019-11-13 LAB — PREGNANCY, URINE: Preg Test, Ur: NEGATIVE

## 2019-11-13 LAB — URINALYSIS, ROUTINE W REFLEX MICROSCOPIC
Bacteria, UA: NONE SEEN
Bilirubin Urine: NEGATIVE
Glucose, UA: NEGATIVE mg/dL
Hgb urine dipstick: NEGATIVE
Ketones, ur: NEGATIVE mg/dL
Nitrite: POSITIVE — AB
Protein, ur: NEGATIVE mg/dL
Specific Gravity, Urine: 1.013 (ref 1.005–1.030)
pH: 6 (ref 5.0–8.0)

## 2019-11-13 LAB — RESPIRATORY PANEL BY RT PCR (FLU A&B, COVID)
Influenza A by PCR: NEGATIVE
Influenza B by PCR: NEGATIVE
SARS Coronavirus 2 by RT PCR: NEGATIVE

## 2019-11-13 LAB — ETHANOL: Alcohol, Ethyl (B): <10 mg/dL (ref <10)

## 2019-11-13 MED ORDER — ONDANSETRON HCL 4 MG PO TABS: 4.0000 mg | ORAL_TABLET | Freq: Three times a day (TID) | ORAL | Status: DC | PRN

## 2019-11-13 MED ORDER — CLONIDINE HCL 0.1 MG PO TABS: 0.1000 mg | ORAL_TABLET | Freq: Two times a day (BID) | ORAL | Status: DC

## 2019-11-13 MED ORDER — CLONIDINE HCL 0.1 MG PO TABS
0.1000 mg | ORAL_TABLET | Freq: Four times a day (QID) | ORAL | Status: DC
  Administered 2019-11-13: 0.1 mg via ORAL
  Filled 2019-11-13: qty 1

## 2019-11-13 MED ORDER — CLONIDINE HCL 0.1 MG PO TABS: 0.1000 mg | ORAL_TABLET | Freq: Every day | ORAL | Status: DC

## 2019-11-13 MED ORDER — LOPERAMIDE HCL 2 MG PO CAPS: 2.0000 mg | ORAL_CAPSULE | ORAL | Status: DC | PRN

## 2019-11-13 MED ORDER — CEPHALEXIN 500 MG PO CAPS
500.0000 mg | ORAL_CAPSULE | Freq: Three times a day (TID) | ORAL | Status: DC
  Administered 2019-11-13: 500 mg via ORAL
  Filled 2019-11-13: qty 1

## 2019-11-13 MED ORDER — ACETAMINOPHEN 325 MG PO TABS: 650.0000 mg | ORAL_TABLET | ORAL | Status: DC | PRN

## 2019-11-13 MED ORDER — METHOCARBAMOL 500 MG PO TABS
500.0000 mg | ORAL_TABLET | Freq: Three times a day (TID) | ORAL | Status: DC | PRN
  Administered 2019-11-13: 500 mg via ORAL
  Filled 2019-11-13: qty 1

## 2019-11-13 MED ORDER — NAPROXEN 250 MG PO TABS
500.0000 mg | ORAL_TABLET | Freq: Two times a day (BID) | ORAL | Status: DC | PRN
  Administered 2019-11-13: 500 mg via ORAL
  Filled 2019-11-13: qty 2

## 2019-11-13 MED ORDER — HYDROXYZINE HCL 25 MG PO TABS
25.0000 mg | ORAL_TABLET | Freq: Four times a day (QID) | ORAL | Status: DC | PRN
  Administered 2019-11-13: 25 mg via ORAL
  Filled 2019-11-13: qty 1

## 2019-11-13 MED ORDER — DICYCLOMINE HCL 10 MG PO CAPS: 20.0000 mg | ORAL_CAPSULE | Freq: Four times a day (QID) | ORAL | Status: DC | PRN
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
## 2019-11-13 NOTE — ED Notes (Signed)
Pt called RN to room stating, "I need my discharge paperwork. I need to leave". RN spoke with pt concerning why she needed to leave. Pt was concerned about increasing withdrawal symptoms. RN spoke with pt about orders that were in place to help decrease the withdrawal symptoms. Pt still very insistent that she is leaving. Pt did decide to leave AMA. Belongings given to pt from lockers. Pt walked out ambulatory. Denies pain. Dr. Jacqulyn Bath notified. Outpatient resources given to pt.

## 2019-11-13 NOTE — Progress Notes (Signed)
Pt meets inpatient criteria per Nira Conn, NP. Referral information has been sent to the following hospitals for review:  [facility] Details [facility] Details [facility] Details [facility] Details  [facility] Details  [facility] Details  [facility] Details [facility]   Disposition will continue to assist with inpatient placement needs.   Wells Guiles, LCSW, LCAS Disposition CSW [facility]/TTS [phone]

## 2019-11-13 NOTE — ED Notes (Addendum)
Pt has changed out into behavioral health scrubs. Belongings placed in lockers. Security called to wand pt. Dr. Manus Gunning reported pt does not need a sitter.

## 2019-11-13 NOTE — ED Provider Notes (Signed)
[facility] EMERGENCY DEPARTMENT Provider Note   CSN: [id number] Arrival date & time: 11/13/19  0516     History Chief Complaint  Patient presents with  . V70.1    Lynn Marshall is a 39 y.o. female.  Patient with history of bipolar disorder, PTSD, polysubstance abuse including heroin and methamphetamine presenting with concerns for "worsening mental health and depression" and detox request.  States she feels like she is deteriorating ever since she relapsed on heroin and methamphetamine.  She was incarcerated for 8 months ending in September 2020 for about 1 month and started using heroin and meth again daily.  States she has not taking any of her mental health medications and has not had them for many months.  She has had increasing thoughts of depression and anxiety but denies any thoughts of suicide at this time.  States she is hearing her conscience but denies hearing any voices.  Denies any homicidal thoughts.  Physically she has noticed some burning with urination over the past several days.  She has had no abdominal pain or back pain.  No fever, chills, nausea or vomiting.  Has noticed increased swelling in her legs but uses her legs for injection sites.  States she was hospitalized for bacteremia and endocarditis 2 years ago and left before the treatment was complete.  States she woke up this morning and became concerned that she needed help because she was worried about her worsening depression and substance abuse.  The history is provided by the patient.       Past Medical History:  Diagnosis Date  . ADHD (attention deficit hyperactivity disorder)   . Anxiety   . Bipolar 1 disorder (HCC)   . Depression   . Heroin addiction (HCC)   . IV drug abuse (H[facility])   . 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 Manic depressive disorder (HCC)   . PTSD (post-traumatic stress disorder)     Patient Active Problem List   Diagnosis Date Noted  . Substance induced mood  disorder (HCC) 03/12/2016  . Cocaine abuse (HCC)   . Heroin use   . Opioid type dependence, continuous (HCC) 01/04/2015  . Polysubstance dependence including opioid drug with daily use (HCC) 01/04/2015  . PTSD (post-traumatic stress disorder) 01/04/2015  . Polysubstance abuse (HCC) 01/03/2015    Past Surgical History:  Procedure Laterality Date  . CESAREAN SECTION    . TONSILLECTOMY       OB History    Gravida  1   Para  0   Term  0   Preterm  0   AB  0   Living        SAB  0   TAB  0   Ectopic  0   Multiple      Live Births              No family history on file.  Social History   Tobacco Use  . Smoking status: Current Every Day Smoker    Packs/day: 0.50    Types: Cigarettes  . Smokeless tobacco: Never Used  Substance Use Topics  . Alcohol use: No    Comment: occassionally  . Drug use: Not Currently    Types: Marijuana, Cocaine, IV    Comment: heroin; uses 65mg  of methadone     Home Medications Prior to Admission medications   Medication Sig Start Date End Date Taking? Authorizing Provider  gabapentin (NEURONTIN) 600 MG tablet Take 600 mg by mouth 3 (  three) times daily.    [provider]  HYDROcodone-acetaminophen (NORCO/VICODIN) 5-325 MG tablet Take 1 tablet by mouth every 4 (four) hours as needed. 03/06/19   Zameer Borman, Annie Main, MD  hydrOXYzine (VISTARIL) 25 MG capsule Take 25 mg by mouth every 6 (six) hours as needed for anxiety.    [provider]  QUEtiapine (SEROQUEL) 200 MG tablet Take 200 mg by mouth at bedtime.    [provider]    Allergies    Patient has no known allergies.  Review of Systems   Review of Systems  Constitutional: Negative for activity change, appetite change and fever.  HENT: Negative for congestion.   Respiratory: Negative for cough, chest tightness and shortness of breath.   Cardiovascular: Positive for leg swelling. Negative for chest pain.  Gastrointestinal: Negative for nausea and  vomiting.  Genitourinary: Negative for dysuria and hematuria.  Musculoskeletal: Negative for arthralgias and myalgias.  Skin: Positive for wound.  Neurological: Negative for dizziness, weakness and headaches.  Psychiatric/Behavioral: Positive for decreased concentration, dysphoric mood, sleep disturbance and suicidal ideas. The patient is nervous/anxious.    all other systems are negative except as noted in the HPI and PMH.    Physical Exam Updated Vital Signs BP (!) 125/97   Pulse 96   Temp 97.6 F (36.4 C) (Oral)   Resp 16   Ht 5\' 6"  (1.676 m)   Wt 77.1 kg   LMP 11/08/2019   SpO2 100%   BMI 27.44 kg/m   Physical Exam Vitals and nursing note reviewed.  Constitutional:      General: She is not in acute distress.    Appearance: She is well-developed.     Comments: Calm and cooperative  HENT:     Head: Normocephalic and atraumatic.     Mouth/Throat:     Pharynx: No oropharyngeal exudate.  Eyes:     Conjunctiva/sclera: Conjunctivae normal.     Pupils: Pupils are equal, round, and reactive to light.  Neck:     Comments: No meningismus. Cardiovascular:     Rate and Rhythm: Normal rate and regular rhythm.     Heart sounds: Normal heart sounds.  Pulmonary:     Effort: Pulmonary effort is normal. No respiratory distress.     Breath sounds: Normal breath sounds.  Abdominal:     Palpations: Abdomen is soft.     Tenderness: There is no abdominal tenderness. There is no guarding or rebound.  Musculoskeletal:        General: No tenderness. Normal range of motion.     Cervical back: Normal range of motion and neck supple.  Skin:    General: Skin is warm.     Findings: Lesion present.     Comments: Multiple track marks of upper and lower extremities.  No Janeway lesions or Osler's nodes Nail polish in place unable to assess for splinter hemorrhages  Neurological:     Mental Status: She is alert and oriented to person, place, and time.     Cranial Nerves: No cranial nerve  deficit.     Motor: No abnormal muscle tone.     Coordination: Coordination normal.     Comments: No ataxia on finger to nose bilaterally. No pronator drift. 5/5 strength throughout. CN 2-12 intact.Equal grip strength. Sensation intact.   Psychiatric:        Behavior: Behavior normal.     ED Results / Procedures / Treatments   Labs (all labs ordered are listed, but only abnormal results are displayed)  Labs Reviewed  CBC WITH DIFFERENTIAL/PLATELET - Abnormal; Notable for the following components:      Result Value   Hemoglobin 10.9 (*)    HCT 35.8 (*)    MCV 79.2 (*)    MCH 24.1 (*)    RDW 17.9 (*)    Platelets 412 (*)    All other components within normal limits  COMPREHENSIVE METABOLIC PANEL - Abnormal; Notable for the following components:   Glucose, Bld 114 (*)    Calcium 8.7 (*)    All other components within normal limits  CULTURE, BLOOD (ROUTINE X 2)  CULTURE, BLOOD (ROUTINE X 2)  RESPIRATORY PANEL BY RT PCR (FLU A&B, COVID)  ETHANOL  URINALYSIS, ROUTINE W REFLEX MICROSCOPIC  PREGNANCY, URINE  RAPID URINE DRUG SCREEN, HOSP PERFORMED    EKG None  Radiology No results found.  Procedures Procedures (including critical care time)  Medications Ordered in ED Medications - No data to display  ED Course  I have reviewed the triage vital signs and the nursing notes.  Pertinent labs & imaging results that were available during my care of the patient were reviewed by me and considered in my medical decision making (see chart for details).    MDM Rules/Calculators/A&P                     Depression, anxiety, polysubstance abuse.  Patient calm and cooperative, denies any current suicidal thoughts or homicidal thoughts. Afebrile with stable vital signs.  Physical exam consistent with chronic IV drug abuse.  Patient is worried that her symptoms will degenerate and her depression will worsen.  She is agreeable to speak to TTS counselor. Screening labs and urinalysis  obtained.  TTS consult complete.  Patient does meet inpatient criteria.  Patient's mother is concerned that patient is suicidal though she denies it at this time.  Patient is calm and cooperative.  She is voluntary at this point and willing to accept inpatient recommendations.  Holding orders are placed. Final Clinical Impression(s) / ED Diagnoses Final diagnoses:  Polysubstance abuse (HCC)  Depressive episode    Rx / DC Orders ED Discharge Orders    None       Corene Resnick, Jeannett Senior, MD 11/13/19 [phone]

## 2019-11-13 NOTE — ED Provider Notes (Signed)
Blood pressure (!) 125/97, pulse 96, temperature 97.6 F (36.4 C), temperature source Oral, resp. rate 16, height 5\' 6"  (1.676 m), weight 77.1 kg, last menstrual period 11/08/2019, SpO2 100 %.  Assuming care from Dr. 11/10/2019.  In short, Lynn Marshall is a 39 y.o. female with a chief complaint of V70.1 .  Refer to the original H&P for additional details.  The current plan of care is to f/u on TTS placement.  12:52 PM  The patient bedside.  She is feeling increasingly agitated and is asking about leaving.  In speaking with her she does want to stay and get help but feels like she is experiencing some withdrawal symptoms.  I have ordered the opiate withdrawal order set with clonidine and other as needed medications.  She is agreeable to try this.  I have reviewed both the EDP and TTS notes.  They do recommend inpatient and feel that they may have a bad at 7 PM.  I discussed this with the patient and she is willing to try the withdrawal medications and stay.   She denies any suicidal or homicidal ideation.  The EDP note and TTS notes make it clear that the patient is here voluntarily.  I do not have sufficient information based on my reevaluation to change that status and IVC the patient.   01:42 PM  Unfortunately the patient has decided to leave AGAINST MEDICAL ADVICE after trying the opiate withdrawal medications. As stated above I do not have enough to justify IVC paperwork at this time. We provided a list of local resources to local detox facilities.  We discussed ED return precautions immediately with any worsening depression or if her symptoms change and she finds her self to be developing SI/HI.     31, MD 11/13/19 1344

## 2019-11-13 NOTE — ED Triage Notes (Signed)
Pt states that she does not feel like herself and is requesting assistance with detox from meth, heroin use. Pt states that her last use was a hour ago. Pt denies any SI or HI

## 2019-11-13 NOTE — ED Notes (Signed)
Spoke with [place], [facility] and pt has been accepted at [facility] after 1900 tonight. Accepting doctor Dr. Marilynn Latino.

## 2019-11-19 LAB — CULTURE, BLOOD (ROUTINE X 2)
Culture: NO GROWTH
Culture: NO GROWTH
Special Requests: ADEQUATE
Special Requests: ADEQUATE

## 2021-03-27 IMAGING — DX RIGHT HAND - COMPLETE 3+ VIEW
3 series · 3 of 3 positions shown · non-contrast
Comparison: None.

CLINICAL DATA: 37 y/o F; injury with swelling and obvious
deformity.

EXAM:
RIGHT WRIST - COMPLETE 3+ VIEW; RIGHT HAND - COMPLETE 3+ VIEW

[hand pa]
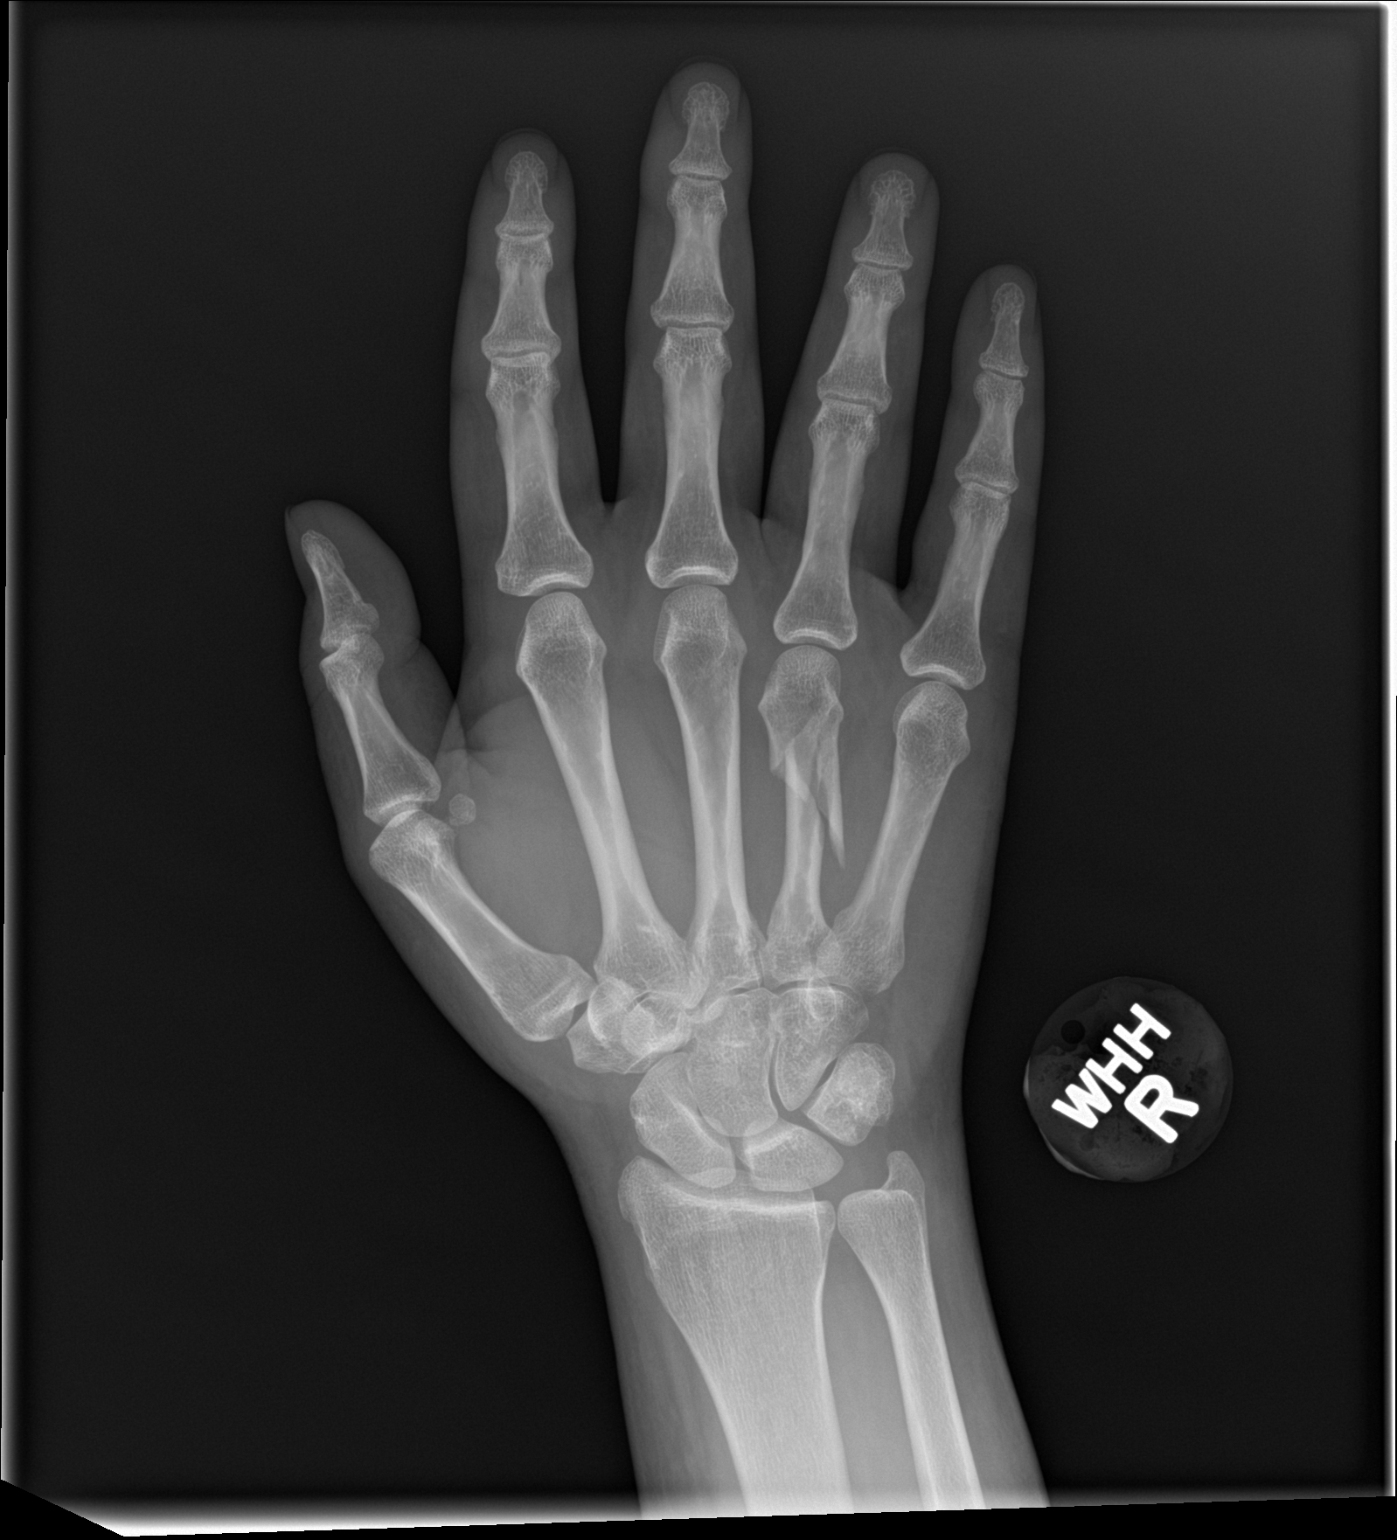

[hand obl]
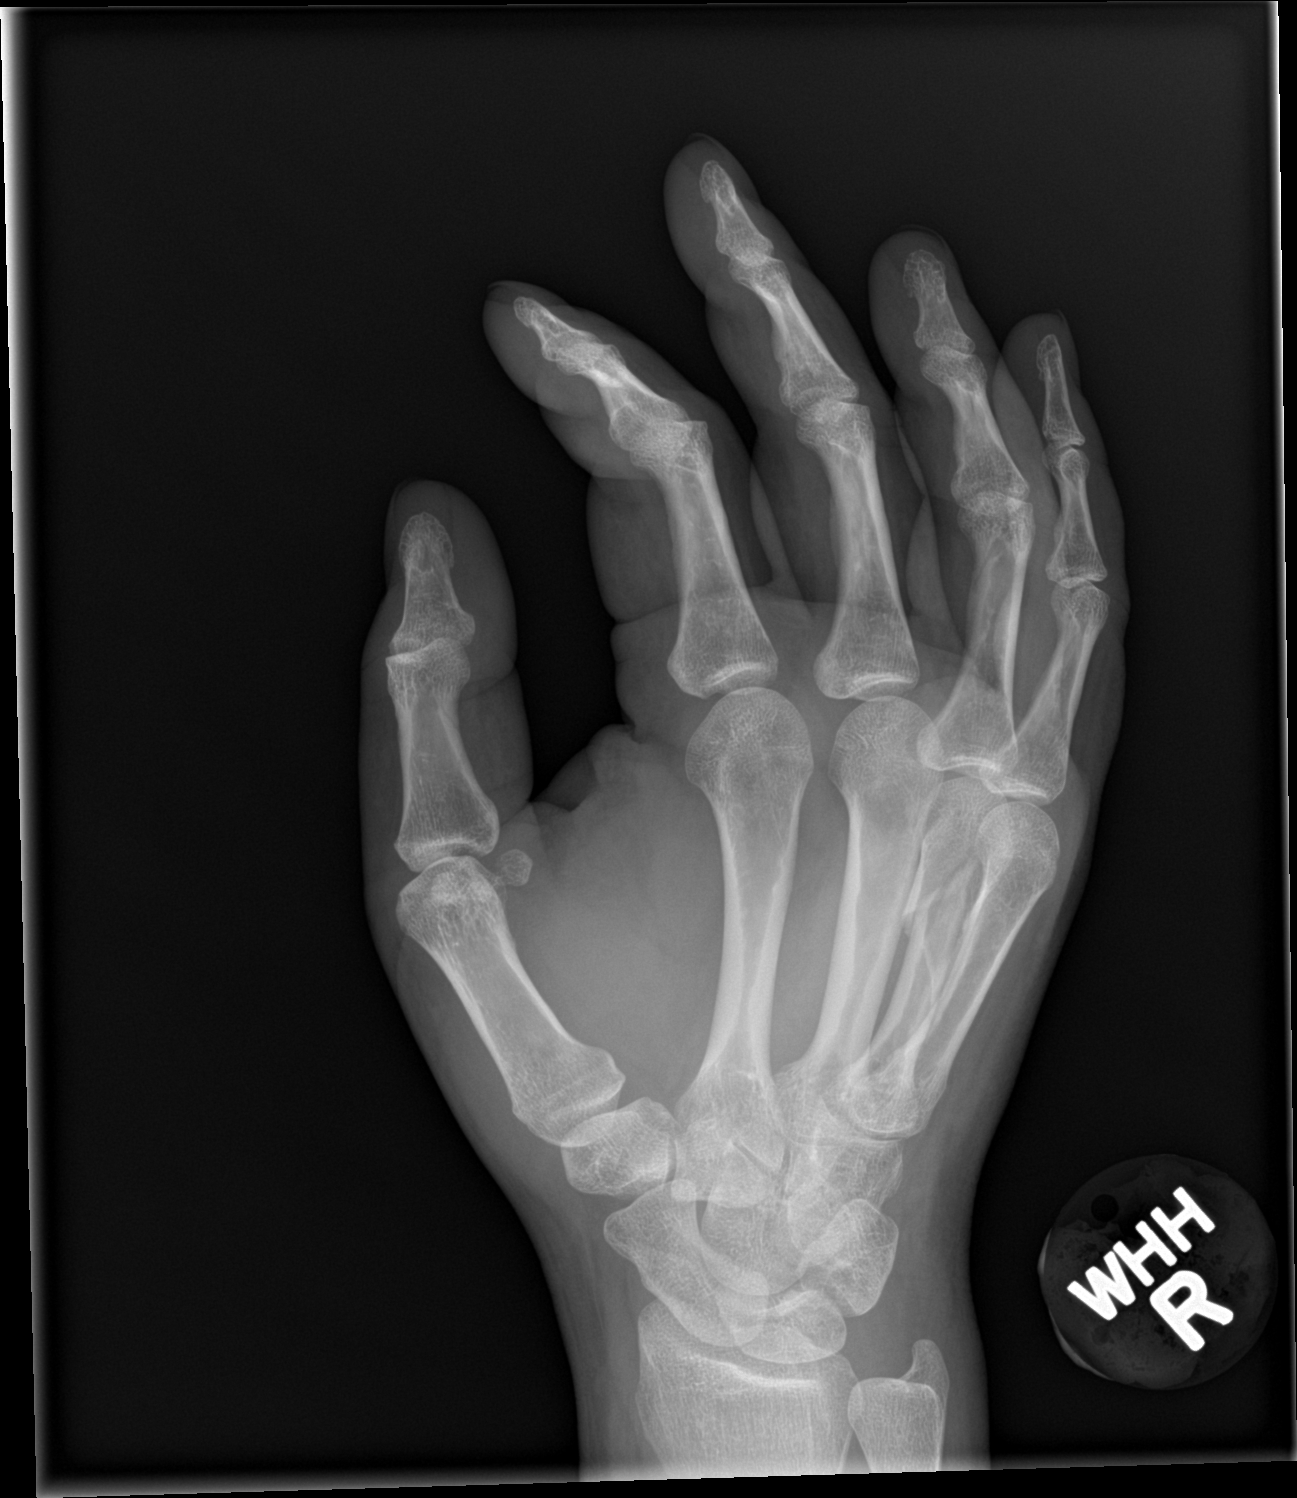

[hand lat]
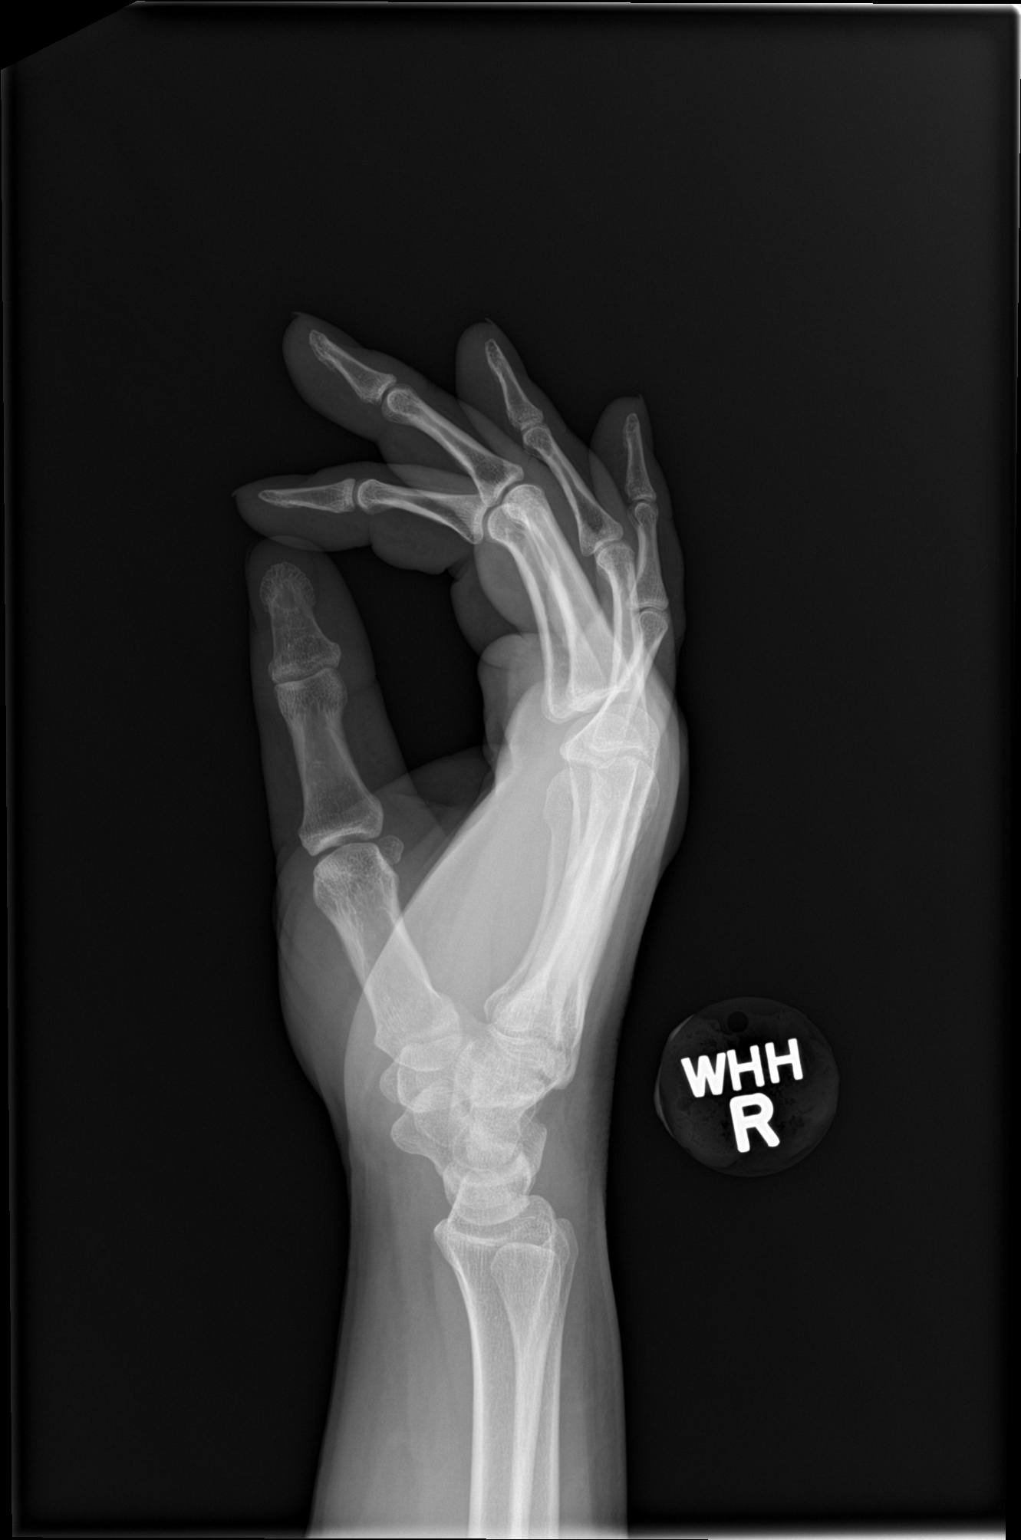

[3 of 3 positions shown; findings below may reference images not displayed]

FINDINGS: Right wrist:

Acute oblique overriding and displaced fracture of the fourth
metacarpal distal diaphysis. No additional fracture or dislocation
identified. Wrist joint is maintained.

Right hand:

Acute oblique overriding and displaced fracture of the fourth
metacarpal distal diaphysis. No additional fracture or dislocation
identified.
IMPRESSION: 1. Acute oblique overriding and displaced fracture of the fourth
metacarpal distal diaphysis.
2. No additional fracture or dislocation identified.

## 2021-03-27 IMAGING — DX RIGHT WRIST - COMPLETE 3+ VIEW
4 series · 4 of 4 positions shown · non-contrast
Comparison: None.

CLINICAL DATA: 37 y/o F; injury with swelling and obvious
deformity.

EXAM:
RIGHT WRIST - COMPLETE 3+ VIEW; RIGHT HAND - COMPLETE 3+ VIEW

[wrist pa]
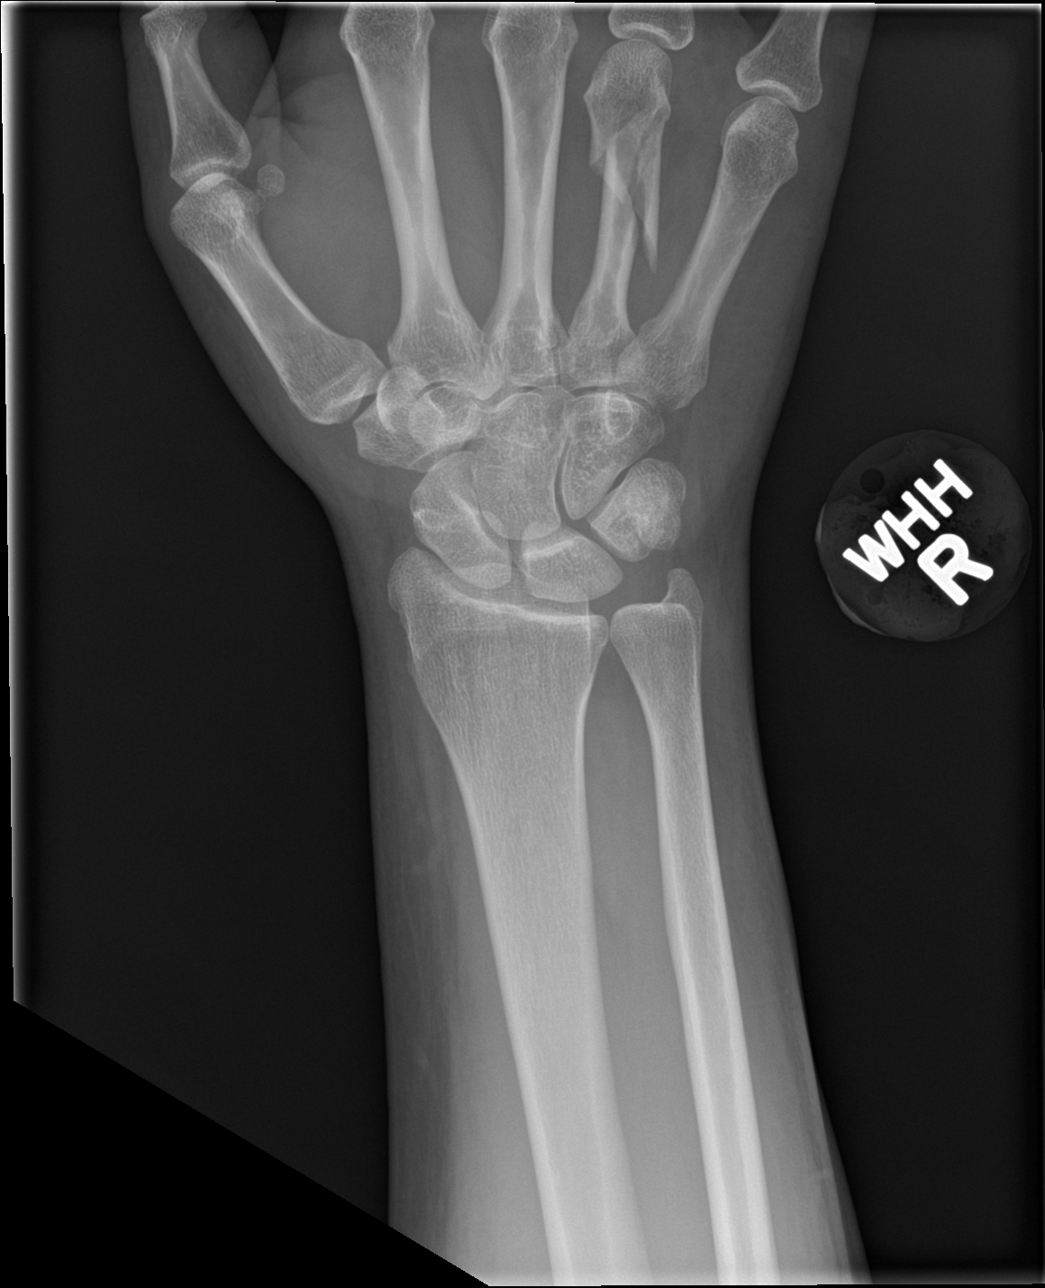

[wrist obl]
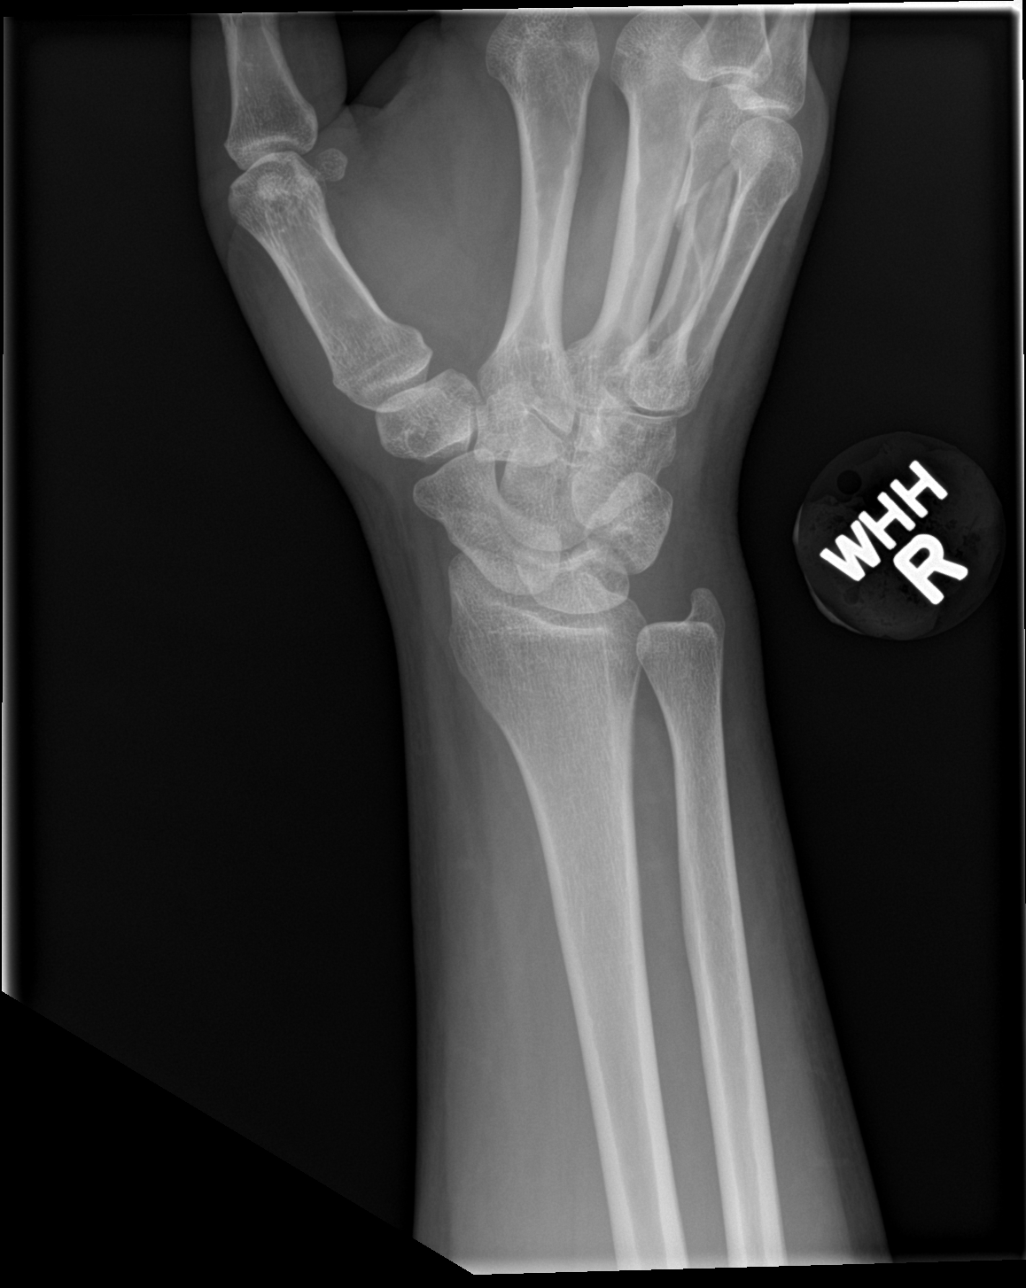

[wrist lat]
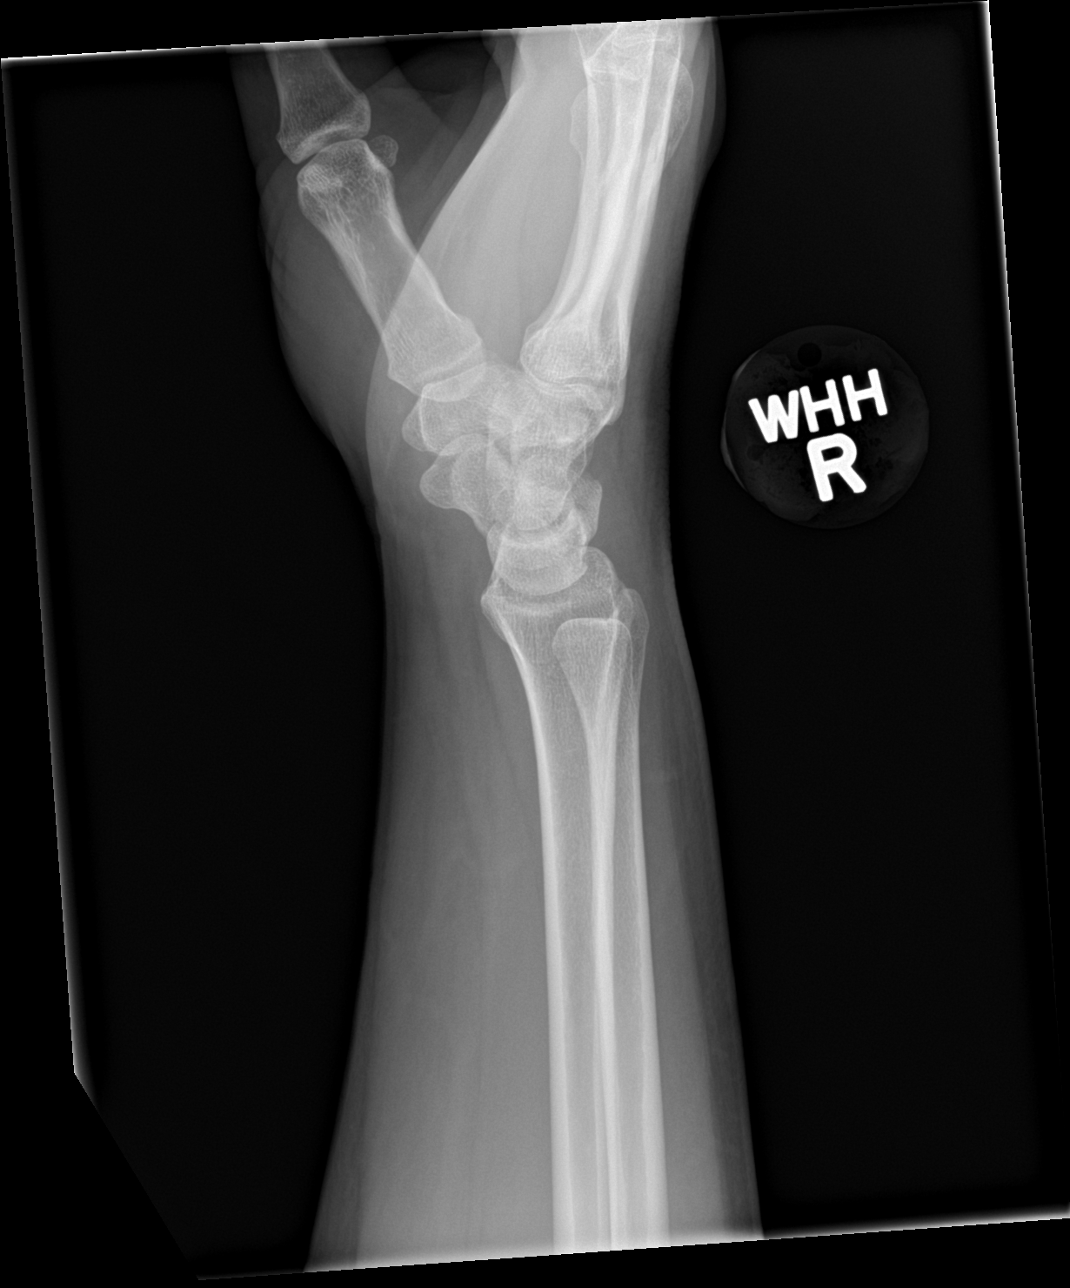

[wrist navicular]
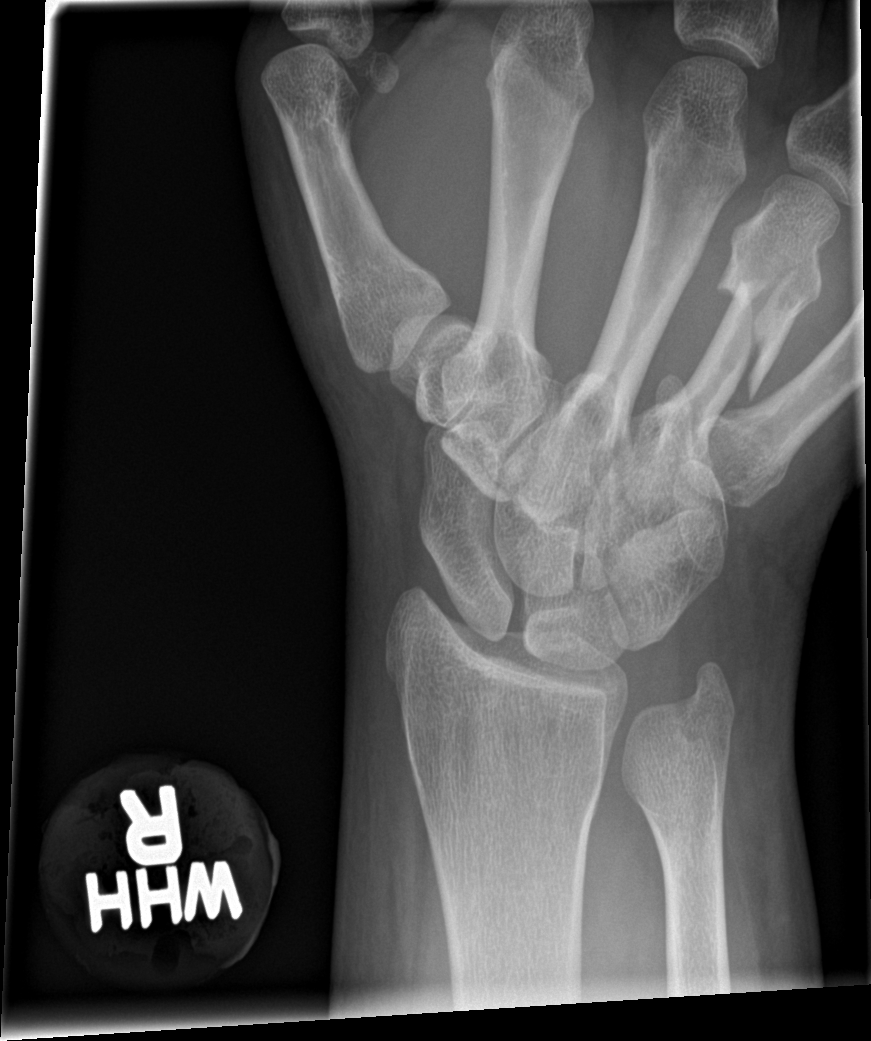

[4 of 4 positions shown; findings below may reference images not displayed]

FINDINGS: Right wrist:

Acute oblique overriding and displaced fracture of the fourth
metacarpal distal diaphysis. No additional fracture or dislocation
identified. Wrist joint is maintained.

Right hand:

Acute oblique overriding and displaced fracture of the fourth
metacarpal distal diaphysis. No additional fracture or dislocation
identified.
IMPRESSION: 1. Acute oblique overriding and displaced fracture of the fourth
metacarpal distal diaphysis.
2. No additional fracture or dislocation identified.

## 2023-10-08 DIAGNOSIS — F1210 Cannabis abuse, uncomplicated: Secondary | ICD-10-CM | POA: Diagnosis not present

## 2023-10-08 DIAGNOSIS — F1120 Opioid dependence, uncomplicated: Secondary | ICD-10-CM | POA: Diagnosis not present

## 2023-10-25 DIAGNOSIS — I10 Essential (primary) hypertension: Secondary | ICD-10-CM | POA: Diagnosis not present

## 2023-10-25 DIAGNOSIS — R059 Cough, unspecified: Secondary | ICD-10-CM | POA: Diagnosis not present

## 2023-10-25 DIAGNOSIS — J189 Pneumonia, unspecified organism: Secondary | ICD-10-CM | POA: Diagnosis not present

## 2023-10-26 DIAGNOSIS — Z720 Tobacco use: Secondary | ICD-10-CM | POA: Diagnosis not present

## 2023-10-26 DIAGNOSIS — F17210 Nicotine dependence, cigarettes, uncomplicated: Secondary | ICD-10-CM | POA: Diagnosis not present

## 2023-10-26 DIAGNOSIS — M542 Cervicalgia: Secondary | ICD-10-CM | POA: Diagnosis not present

## 2023-10-26 DIAGNOSIS — R079 Chest pain, unspecified: Secondary | ICD-10-CM | POA: Diagnosis not present

## 2023-10-26 DIAGNOSIS — R000 Tachycardia, unspecified: Secondary | ICD-10-CM | POA: Diagnosis not present

## 2023-10-26 DIAGNOSIS — R0789 Other chest pain: Secondary | ICD-10-CM | POA: Diagnosis not present

## 2023-10-26 DIAGNOSIS — R059 Cough, unspecified: Secondary | ICD-10-CM | POA: Diagnosis not present

## 2023-10-26 DIAGNOSIS — F319 Bipolar disorder, unspecified: Secondary | ICD-10-CM | POA: Diagnosis not present

## 2023-10-26 DIAGNOSIS — Z743 Need for continuous supervision: Secondary | ICD-10-CM | POA: Diagnosis not present

## 2023-10-26 DIAGNOSIS — Z20822 Contact with and (suspected) exposure to covid-19: Secondary | ICD-10-CM | POA: Diagnosis not present

## 2023-10-26 DIAGNOSIS — J101 Influenza due to other identified influenza virus with other respiratory manifestations: Secondary | ICD-10-CM | POA: Diagnosis not present

## 2023-10-27 ENCOUNTER — Inpatient Hospital Stay (HOSPITAL_COMMUNITY)
Admission: EM | Admit: 2023-10-27 | Discharge: 2023-11-23 | DRG: 871 | Disposition: E | Attending: Pulmonary Disease | Admitting: Pulmonary Disease

## 2023-10-27 ENCOUNTER — Other Ambulatory Visit: Payer: Self-pay

## 2023-10-27 ENCOUNTER — Inpatient Hospital Stay (HOSPITAL_COMMUNITY)

## 2023-10-27 ENCOUNTER — Encounter (HOSPITAL_COMMUNITY): Payer: Self-pay | Admitting: *Deleted

## 2023-10-27 ENCOUNTER — Emergency Department (HOSPITAL_COMMUNITY)

## 2023-10-27 DIAGNOSIS — R6521 Severe sepsis with septic shock: Secondary | ICD-10-CM | POA: Diagnosis present

## 2023-10-27 DIAGNOSIS — J939 Pneumothorax, unspecified: Secondary | ICD-10-CM | POA: Diagnosis not present

## 2023-10-27 DIAGNOSIS — Z66 Do not resuscitate: Secondary | ICD-10-CM | POA: Diagnosis not present

## 2023-10-27 DIAGNOSIS — I5082 Biventricular heart failure: Secondary | ICD-10-CM | POA: Diagnosis not present

## 2023-10-27 DIAGNOSIS — Z743 Need for continuous supervision: Secondary | ICD-10-CM | POA: Diagnosis not present

## 2023-10-27 DIAGNOSIS — T797XXA Traumatic subcutaneous emphysema, initial encounter: Secondary | ICD-10-CM | POA: Diagnosis not present

## 2023-10-27 DIAGNOSIS — J90 Pleural effusion, not elsewhere classified: Secondary | ICD-10-CM | POA: Diagnosis not present

## 2023-10-27 DIAGNOSIS — R042 Hemoptysis: Secondary | ICD-10-CM | POA: Diagnosis not present

## 2023-10-27 DIAGNOSIS — R0902 Hypoxemia: Secondary | ICD-10-CM | POA: Diagnosis not present

## 2023-10-27 DIAGNOSIS — R918 Other nonspecific abnormal finding of lung field: Secondary | ICD-10-CM | POA: Diagnosis not present

## 2023-10-27 DIAGNOSIS — A419 Sepsis, unspecified organism: Secondary | ICD-10-CM | POA: Diagnosis not present

## 2023-10-27 DIAGNOSIS — J1001 Influenza due to other identified influenza virus with the same other identified influenza virus pneumonia: Secondary | ICD-10-CM | POA: Diagnosis present

## 2023-10-27 DIAGNOSIS — E876 Hypokalemia: Secondary | ICD-10-CM | POA: Diagnosis present

## 2023-10-27 DIAGNOSIS — D65 Disseminated intravascular coagulation [defibrination syndrome]: Secondary | ICD-10-CM | POA: Diagnosis not present

## 2023-10-27 DIAGNOSIS — J9601 Acute respiratory failure with hypoxia: Principal | ICD-10-CM | POA: Diagnosis present

## 2023-10-27 DIAGNOSIS — N179 Acute kidney failure, unspecified: Secondary | ICD-10-CM | POA: Diagnosis present

## 2023-10-27 DIAGNOSIS — R000 Tachycardia, unspecified: Secondary | ICD-10-CM | POA: Diagnosis not present

## 2023-10-27 DIAGNOSIS — I469 Cardiac arrest, cause unspecified: Secondary | ICD-10-CM | POA: Diagnosis not present

## 2023-10-27 DIAGNOSIS — R579 Shock, unspecified: Secondary | ICD-10-CM

## 2023-10-27 DIAGNOSIS — J9602 Acute respiratory failure with hypercapnia: Secondary | ICD-10-CM | POA: Diagnosis not present

## 2023-10-27 DIAGNOSIS — E871 Hypo-osmolality and hyponatremia: Secondary | ICD-10-CM | POA: Diagnosis present

## 2023-10-27 DIAGNOSIS — J189 Pneumonia, unspecified organism: Secondary | ICD-10-CM

## 2023-10-27 DIAGNOSIS — R0689 Other abnormalities of breathing: Secondary | ICD-10-CM | POA: Diagnosis not present

## 2023-10-27 DIAGNOSIS — J9600 Acute respiratory failure, unspecified whether with hypoxia or hypercapnia: Secondary | ICD-10-CM | POA: Diagnosis present

## 2023-10-27 DIAGNOSIS — A4189 Other specified sepsis: Principal | ICD-10-CM | POA: Diagnosis present

## 2023-10-27 DIAGNOSIS — Z4682 Encounter for fitting and adjustment of non-vascular catheter: Secondary | ICD-10-CM | POA: Diagnosis not present

## 2023-10-27 LAB — CBC WITH DIFFERENTIAL/PLATELET
Abs Immature Granulocytes: 0.3 10*3/uL — ABNORMAL HIGH (ref 0.00–0.07)
Abs Immature Granulocytes: 0.04 10*3/uL (ref 0.00–0.07)
Band Neutrophils: 27 %
Basophils Absolute: 0 10*3/uL (ref 0.0–0.1)
Basophils Absolute: 0 10*3/uL (ref 0.0–0.1)
Basophils Relative: 0 %
Basophils Relative: 1 %
Eosinophils Absolute: 0 10*3/uL (ref 0.0–0.5)
Eosinophils Absolute: 0 10*3/uL (ref 0.0–0.5)
Eosinophils Relative: 0 %
Eosinophils Relative: 1 %
HCT: 50 % — ABNORMAL HIGH (ref 36.0–46.0)
HCT: 31.7 % — ABNORMAL LOW (ref 36.0–46.0)
Hemoglobin: 15.6 g/dL — ABNORMAL HIGH (ref 12.0–15.0)
Hemoglobin: 10.1 g/dL — ABNORMAL LOW (ref 12.0–15.0)
Immature Granulocytes: 2 %
Lymphocytes Relative: 33 %
Lymphocytes Relative: 46 %
Lymphs Abs: 0.4 10*3/uL — ABNORMAL LOW (ref 0.7–4.0)
Lymphs Abs: 1 10*3/uL (ref 0.7–4.0)
MCH: 26.7 pg (ref 26.0–34.0)
MCH: 27.2 pg (ref 26.0–34.0)
MCHC: 31.2 g/dL (ref 30.0–36.0)
MCHC: 31.9 g/dL (ref 30.0–36.0)
MCV: 85.6 fL (ref 80.0–100.0)
MCV: 85.2 fL (ref 80.0–100.0)
Metamyelocytes Relative: 14 %
Monocytes Absolute: 0.2 10*3/uL (ref 0.1–1.0)
Monocytes Absolute: 0.2 10*3/uL (ref 0.1–1.0)
Monocytes Relative: 12 %
Monocytes Relative: 7 %
Myelocytes: 7 %
Neutro Abs: 0.4 10*3/uL — CL (ref 1.7–7.7)
Neutro Abs: 0.9 10*3/uL — ABNORMAL LOW (ref 1.7–7.7)
Neutrophils Relative %: 7 %
Neutrophils Relative %: 43 %
Platelets: 212 10*3/uL (ref 150–400)
Platelets: 73 10*3/uL — ABNORMAL LOW (ref 150–400)
RBC: 5.84 MIL/uL — ABNORMAL HIGH (ref 3.87–5.11)
RBC: 3.72 MIL/uL — ABNORMAL LOW (ref 3.87–5.11)
RDW: 15.9 % — ABNORMAL HIGH (ref 11.5–15.5)
RDW: 15.8 % — ABNORMAL HIGH (ref 11.5–15.5)
Smear Review: ADEQUATE
Smear Review: DECREASED
WBC: 1.3 10*3/uL — CL (ref 4.0–10.5)
WBC: 2.2 10*3/uL — ABNORMAL LOW (ref 4.0–10.5)
nRBC: 0 % (ref 0.0–0.2)
nRBC: 0 % (ref 0.0–0.2)

## 2023-10-27 LAB — DIC (DISSEMINATED INTRAVASCULAR COAGULATION)PANEL
D-Dimer, Quant: >20 ug{FEU}/mL — ABNORMAL HIGH (ref 0.00–0.50)
Fibrinogen: 313 mg/dL (ref 210–475)
INR: 2.1 — ABNORMAL HIGH (ref 0.8–1.2)
Platelets: 74 10*3/uL — ABNORMAL LOW (ref 150–400)
Prothrombin Time: 24.1 s — ABNORMAL HIGH (ref 11.4–15.2)
Smear Review: NONE SEEN
aPTT: 54 s — ABNORMAL HIGH (ref 24–36)

## 2023-10-27 LAB — URINALYSIS, W/ REFLEX TO CULTURE (INFECTION SUSPECTED)
Bilirubin Urine: NEGATIVE
Glucose, UA: NEGATIVE mg/dL
Ketones, ur: NEGATIVE mg/dL
Nitrite: NEGATIVE
Protein, ur: 100 mg/dL — AB
RBC / HPF: >50 RBC/hpf (ref 0–5)
Specific Gravity, Urine: 1.02 (ref 1.005–1.030)
pH: 5 (ref 5.0–8.0)

## 2023-10-27 LAB — COMPREHENSIVE METABOLIC PANEL
ALT: 12 U/L (ref 0–44)
ALT: 8 U/L (ref 0–44)
AST: 38 U/L (ref 15–41)
AST: 19 U/L (ref 15–41)
Albumin: 1.9 g/dL — ABNORMAL LOW (ref 3.5–5.0)
Albumin: <1.5 g/dL — ABNORMAL LOW (ref 3.5–5.0)
Alkaline Phosphatase: 33 U/L — ABNORMAL LOW (ref 38–126)
Alkaline Phosphatase: 27 U/L — ABNORMAL LOW (ref 38–126)
Anion gap: 15 (ref 5–15)
Anion gap: 17 — ABNORMAL HIGH (ref 5–15)
BUN: 24 mg/dL — ABNORMAL HIGH (ref 6–20)
BUN: 25 mg/dL — ABNORMAL HIGH (ref 6–20)
CO2: 16 mmol/L — ABNORMAL LOW (ref 22–32)
CO2: 26 mmol/L (ref 22–32)
Calcium: 6.4 mg/dL — CL (ref 8.9–10.3)
Calcium: 6.2 mg/dL — CL (ref 8.9–10.3)
Chloride: 102 mmol/L (ref 98–111)
Chloride: 99 mmol/L (ref 98–111)
Creatinine, Ser: 2.11 mg/dL — ABNORMAL HIGH (ref 0.44–1.00)
Creatinine, Ser: 2.43 mg/dL — ABNORMAL HIGH (ref 0.44–1.00)
GFR, Estimated: 30 mL/min — ABNORMAL LOW (ref >60)
GFR, Estimated: 25 mL/min — ABNORMAL LOW (ref >60)
Glucose, Bld: 123 mg/dL — ABNORMAL HIGH (ref 70–99)
Glucose, Bld: 75 mg/dL (ref 70–99)
Potassium: 2.8 mmol/L — ABNORMAL LOW (ref 3.5–5.1)
Potassium: 3 mmol/L — ABNORMAL LOW (ref 3.5–5.1)
Sodium: 133 mmol/L — ABNORMAL LOW (ref 135–145)
Sodium: 142 mmol/L (ref 135–145)
Total Bilirubin: 0.7 mg/dL (ref 0.0–1.2)
Total Bilirubin: 0.7 mg/dL (ref 0.0–1.2)
Total Protein: 4.5 g/dL — ABNORMAL LOW (ref 6.5–8.1)
Total Protein: 3.4 g/dL — ABNORMAL LOW (ref 6.5–8.1)

## 2023-10-27 LAB — LACTIC ACID, PLASMA
Lactic Acid, Venous: 8.6 mmol/L (ref 0.5–1.9)
Lactic Acid, Venous: 8.2 mmol/L (ref 0.5–1.9)

## 2023-10-27 LAB — RESP PANEL BY RT-PCR (RSV, FLU A&B, COVID)  RVPGX2
Influenza A by PCR: POSITIVE — AB
Influenza B by PCR: NEGATIVE
Resp Syncytial Virus by PCR: NEGATIVE
SARS Coronavirus 2 by RT PCR: NEGATIVE

## 2023-10-27 LAB — BLOOD GAS, ARTERIAL
Acid-base deficit: 15 mmol/L — ABNORMAL HIGH (ref 0.0–2.0)
Bicarbonate: 17 mmol/L — ABNORMAL LOW (ref 20.0–28.0)
Drawn by: 234
FIO2: 100 %
O2 Saturation: 90.8 %
Patient temperature: 38.3
pCO2 arterial: 73 mmHg (ref 32–48)
pH, Arterial: 6.98 — CL (ref 7.35–7.45)
pO2, Arterial: 84 mmHg (ref 83–108)

## 2023-10-27 LAB — I-STAT CHEM 8, ED
BUN: 33 mg/dL — ABNORMAL HIGH (ref 6–20)
Calcium, Ion: 0.92 mmol/L — ABNORMAL LOW (ref 1.15–1.40)
Chloride: 100 mmol/L (ref 98–111)
Creatinine, Ser: 1.9 mg/dL — ABNORMAL HIGH (ref 0.44–1.00)
Glucose, Bld: 125 mg/dL — ABNORMAL HIGH (ref 70–99)
HCT: 51 % — ABNORMAL HIGH (ref 36.0–46.0)
Hemoglobin: 17.3 g/dL — ABNORMAL HIGH (ref 12.0–15.0)
Potassium: 3.7 mmol/L (ref 3.5–5.1)
Sodium: 133 mmol/L — ABNORMAL LOW (ref 135–145)
TCO2: 18 mmol/L — ABNORMAL LOW (ref 22–32)

## 2023-10-27 LAB — PROTIME-INR
INR: 2 — ABNORMAL HIGH (ref 0.8–1.2)
Prothrombin Time: 22.8 s — ABNORMAL HIGH (ref 11.4–15.2)

## 2023-10-27 LAB — PROCALCITONIN: Procalcitonin: 111.07 ng/mL

## 2023-10-27 LAB — APTT: aPTT: 44 s — ABNORMAL HIGH (ref 24–36)

## 2023-10-27 MED ORDER — CALCIUM GLUCONATE-NACL 1-0.675 GM/50ML-% IV SOLN
1.0000 g | Freq: Once | INTRAVENOUS | Status: AC
Start: 2023-10-27 — End: 2023-10-27

## 2023-10-27 MED ORDER — VANCOMYCIN HCL 1500 MG/300ML IV SOLN
1500.0000 mg | Freq: Once | INTRAVENOUS | Status: AC
Start: 2023-10-27 — End: 2023-10-27
  Administered 2023-10-27: 1500 mg via INTRAVENOUS
  Filled 2023-10-27: qty 300

## 2023-10-27 MED ORDER — FENTANYL CITRATE PF 50 MCG/ML IJ SOSY
PREFILLED_SYRINGE | INTRAMUSCULAR | Status: AC
  Filled 2023-10-27: qty 2

## 2023-10-27 MED ORDER — NOREPINEPHRINE 4 MG/250ML-% IV SOLN
0.0000 ug/min | INTRAVENOUS | Status: DC
Start: 2023-10-27 — End: 2023-10-28
  Administered 2023-10-27: 25 ug/min via INTRAVENOUS
  Administered 2023-10-27: 60 ug/min via INTRAVENOUS
  Filled 2023-10-27: qty 250

## 2023-10-27 MED ORDER — MIDAZOLAM HCL 2 MG/2ML IJ SOLN
INTRAMUSCULAR | Status: AC
  Filled 2023-10-27: qty 2

## 2023-10-27 MED ORDER — FENTANYL CITRATE PF 50 MCG/ML IJ SOSY
50.0000 ug | PREFILLED_SYRINGE | Freq: Once | INTRAMUSCULAR | Status: AC
  Administered 2023-10-27: 50 ug via INTRAVENOUS

## 2023-10-27 MED ORDER — EPINEPHRINE 1 MG/10ML IJ SOSY
PREFILLED_SYRINGE | INTRAMUSCULAR | Status: AC | PRN
  Administered 2023-10-27: 1 mg via INTRAVENOUS

## 2023-10-27 MED ORDER — KETAMINE HCL 50 MG/5ML IJ SOSY
PREFILLED_SYRINGE | INTRAMUSCULAR | Status: AC
  Filled 2023-10-27: qty 10

## 2023-10-27 MED ORDER — NOREPINEPHRINE 4 MG/250ML-% IV SOLN
2.0000 ug/min | INTRAVENOUS | Status: DC
  Filled 2023-10-27: qty 250

## 2023-10-27 MED ORDER — ACETAMINOPHEN 650 MG RE SUPP
650.0000 mg | Freq: Once | RECTAL | Status: AC
  Administered 2023-10-27: 650 mg via RECTAL

## 2023-10-27 MED ORDER — SODIUM BICARBONATE 8.4 % IV SOLN
INTRAVENOUS | Status: AC
  Filled 2023-10-27: qty 100

## 2023-10-27 MED ORDER — SODIUM CHLORIDE 0.9 % IV SOLN
2.0000 g | Freq: Once | INTRAVENOUS | Status: AC
  Administered 2023-10-27: 2 g via INTRAVENOUS
  Filled 2023-10-27: qty 20

## 2023-10-27 MED ORDER — HYDROCORTISONE SOD SUC (PF) 100 MG IJ SOLR
100.0000 mg | INTRAMUSCULAR | Status: AC
  Administered 2023-10-27: 100 mg via INTRAVENOUS

## 2023-10-27 MED ORDER — FENTANYL CITRATE (PF) 100 MCG/2ML IJ SOLN
INTRAMUSCULAR | Status: AC
  Filled 2023-10-27: qty 2

## 2023-10-27 MED ORDER — FENTANYL 2500MCG IN NS 250ML (10MCG/ML) PREMIX INFUSION: 50.0000 ug/h | INTRAVENOUS | Status: DC

## 2023-10-27 MED ORDER — CALCIUM GLUCONATE-NACL 1-0.675 GM/50ML-% IV SOLN
INTRAVENOUS | Status: AC
  Administered 2023-10-27: 1000 mg via INTRAVENOUS
  Filled 2023-10-27: qty 50

## 2023-10-27 MED ORDER — LORAZEPAM 2 MG/ML IJ SOLN: 1.0000 mg | Freq: Once | INTRAMUSCULAR | Status: DC

## 2023-10-27 MED ORDER — SODIUM CHLORIDE 0.9 % IV SOLN: 1.0000 g | Freq: Once | INTRAVENOUS | Status: DC

## 2023-10-27 MED ORDER — ETOMIDATE 2 MG/ML IV SOLN
INTRAVENOUS | Status: AC
  Filled 2023-10-27: qty 20

## 2023-10-27 MED ORDER — EPINEPHRINE HCL 5 MG/250ML IV SOLN IN NS
INTRAVENOUS | Status: AC
  Filled 2023-10-27: qty 250

## 2023-10-27 MED ORDER — ETOMIDATE 2 MG/ML IV SOLN
INTRAVENOUS | Status: AC | PRN
  Administered 2023-10-27: 20 mg via INTRAVENOUS

## 2023-10-27 MED ORDER — HYDROCORTISONE SOD SUC (PF) 100 MG IJ SOLR
INTRAMUSCULAR | Status: AC
  Filled 2023-10-27: qty 2

## 2023-10-27 MED ORDER — MIDAZOLAM HCL 2 MG/2ML IJ SOLN
INTRAMUSCULAR | Status: AC
  Administered 2023-10-27: 2 mg via INTRAVENOUS
  Filled 2023-10-27: qty 2

## 2023-10-27 MED ORDER — ALBUTEROL SULFATE (2.5 MG/3ML) 0.083% IN NEBU
INHALATION_SOLUTION | RESPIRATORY_TRACT | Status: AC
  Administered 2023-10-27: 5 mg via RESPIRATORY_TRACT
  Filled 2023-10-27: qty 6

## 2023-10-27 MED ORDER — ROCURONIUM BROMIDE 10 MG/ML (PF) SYRINGE
PREFILLED_SYRINGE | INTRAVENOUS | Status: AC
  Filled 2023-10-27: qty 10

## 2023-10-27 MED ORDER — VASOPRESSIN 20 UNITS/100 ML INFUSION FOR SHOCK
INTRAVENOUS | Status: AC
  Administered 2023-10-27: 0.03 [IU]/min via INTRAVENOUS
  Filled 2023-10-27: qty 100

## 2023-10-27 MED ORDER — MIDAZOLAM-SODIUM CHLORIDE 100-0.9 MG/100ML-% IV SOLN
0.5000 mg/h | INTRAVENOUS | Status: DC
  Administered 2023-10-27: 0.5 mg/h via INTRAVENOUS

## 2023-10-27 MED ORDER — HEPARIN SODIUM (PORCINE) 5000 UNIT/ML IJ SOLN: 5000.0000 [IU] | Freq: Three times a day (TID) | INTRAMUSCULAR | Status: DC

## 2023-10-27 MED ORDER — VASOPRESSIN 20 UNITS/100 ML INFUSION FOR SHOCK: 0.0000 [IU]/min | INTRAVENOUS | Status: DC

## 2023-10-27 MED ORDER — MIDAZOLAM HCL 2 MG/2ML IJ SOLN: 1.0000 mg | INTRAMUSCULAR | Status: DC | PRN

## 2023-10-27 MED ORDER — FENTANYL 2500MCG IN NS 250ML (10MCG/ML) PREMIX INFUSION
INTRAVENOUS | Status: AC
  Administered 2023-10-27: 50 ug/h via INTRAVENOUS
  Filled 2023-10-27: qty 250

## 2023-10-27 MED ORDER — SODIUM BICARBONATE 8.4 % IV SOLN
50.0000 meq | Freq: Once | INTRAVENOUS | Status: AC
  Administered 2023-10-27: 50 meq via INTRAVENOUS

## 2023-10-27 MED ORDER — FENTANYL BOLUS VIA INFUSION: 50.0000 ug | INTRAVENOUS | Status: DC | PRN

## 2023-10-27 MED ORDER — ALBUTEROL SULFATE (2.5 MG/3ML) 0.083% IN NEBU: 5.0000 mg | INHALATION_SOLUTION | Freq: Once | RESPIRATORY_TRACT | Status: AC

## 2023-10-27 MED ORDER — LACTATED RINGERS IV BOLUS: 1000.0000 mL | Freq: Once | INTRAVENOUS | Status: DC

## 2023-10-27 MED ORDER — NOREPINEPHRINE 4 MG/250ML-% IV SOLN
INTRAVENOUS | Status: AC
  Administered 2023-10-27: 5 ug/min via INTRAVENOUS
  Filled 2023-10-27: qty 250

## 2023-10-27 MED ORDER — SODIUM CHLORIDE 0.9 % IV SOLN
500.0000 mg | Freq: Once | INTRAVENOUS | Status: AC
  Administered 2023-10-27: 500 mg via INTRAVENOUS
  Filled 2023-10-27: qty 5

## 2023-10-27 MED ORDER — SUCCINYLCHOLINE CHLORIDE 200 MG/10ML IV SOSY
PREFILLED_SYRINGE | INTRAVENOUS | Status: AC
  Filled 2023-10-27: qty 10

## 2023-10-27 MED ORDER — ROCURONIUM BROMIDE 10 MG/ML (PF) SYRINGE
PREFILLED_SYRINGE | INTRAVENOUS | Status: AC | PRN
  Administered 2023-10-27: 80 mg via INTRAVENOUS

## 2023-10-27 MED ORDER — STERILE WATER FOR INJECTION IV SOLN
INTRAVENOUS | Status: DC
  Filled 2023-10-27 (×4): qty 1000

## 2023-10-28 ENCOUNTER — Telehealth: Payer: Self-pay | Admitting: *Deleted

## 2023-10-28 LAB — POCT I-STAT 7, (LYTES, BLD GAS, ICA,H+H)
Acid-base deficit: 3 mmol/L — ABNORMAL HIGH (ref 0.0–2.0)
Bicarbonate: 26.6 mmol/L (ref 20.0–28.0)
Calcium, Ion: 0.92 mmol/L — ABNORMAL LOW (ref 1.15–1.40)
HCT: 30 % — ABNORMAL LOW (ref 36.0–46.0)
Hemoglobin: 10.2 g/dL — ABNORMAL LOW (ref 12.0–15.0)
O2 Saturation: 86 %
Patient temperature: 39
Potassium: 2.6 mmol/L — CL (ref 3.5–5.1)
Sodium: 142 mmol/L (ref 135–145)
TCO2: 29 mmol/L (ref 22–32)
pCO2 arterial: 80.2 mmHg (ref 32–48)
pH, Arterial: 7.14 — CL (ref 7.35–7.45)
pO2, Arterial: 77 mmHg — ABNORMAL LOW (ref 83–108)

## 2023-10-28 NOTE — Telephone Encounter (Signed)
 Pt mom called for next steps after passing of daughter.

## 2023-10-28 NOTE — Telephone Encounter (Signed)
 RNCM contacted Tim with pathology for additional information for pt mom.  Tim advised to have pt contact medical examiner Quita Skye [phone] or [phone]. Jorja Loa also advised that pt reach out to Patient [facility]acement [phone] to give instructions on where pt body should be sent.  RNCM reached out to pt mom to relay information and received unidentified voice mail.  Will provide information when call is returned.

## 2023-11-01 LAB — CULTURE, BLOOD (ROUTINE X 2)
Culture: NO GROWTH
Culture: NO GROWTH
Special Requests: ADEQUATE

## 2023-11-23 NOTE — Progress Notes (Signed)
 ED Pharmacy Antibiotic Sign Off An antibiotic consult was received from an ED provider for sepsis per pharmacy dosing for Vancomycin. A chart review was completed to assess appropriateness.   The following one time order(s) were placed:  Vancomycin 1500mg  IV x 1  Further antibiotic and/or antibiotic pharmacy consults should be ordered by the admitting provider if indicated.   Thank you for allowing pharmacy to be a part of this patient's care.   Tera Mater, [facility]  Clinical Pharmacist 11/05/2023 1:36 PM

## 2023-11-23 NOTE — Code Documentation (Signed)
 Pt had a rhythm change with no pulse detected, PEA on monitor, CPR started, Dr Criss Alvine remains at bedside,

## 2023-11-23 NOTE — ED Notes (Signed)
 Small packet of purple powder found in patients bra. Dr. Criss Alvine notified. [employer] and PD took substance.

## 2023-11-23 NOTE — ED Notes (Signed)
 Pulses noted, hr 190's. Dr Criss Alvine remains at bedside,

## 2023-11-23 NOTE — ED Notes (Signed)
 ED Provider at bedside.

## 2023-11-23 NOTE — Sepsis Progress Note (Signed)
 eLink is following this Code Sepsis.

## 2023-11-23 NOTE — Progress Notes (Signed)
 of Fentanyl and 95mL of versed is wasted in medroom with Darlis Loan, RN.

## 2023-11-23 NOTE — Progress Notes (Signed)
 Pt time of death 63. Pronounced by myself and Particia Jasper, RN. No heart sounds nor breath sounds auscultated. Attending at bedside and aware. Dr. Denese Killings attempted to call patient's mother.

## 2023-11-23 NOTE — ED Provider Notes (Signed)
 [place] EMERGENCY DEPARTMENT AT [facility] Provider Note   CSN: [id number] Arrival date & time: 11/03/2023  1305     History  Chief Complaint  Patient presents with   Respiratory Distress    Lynn Marshall is a 43 y.o. female.  HPI 43 year old female presents with respiratory distress.  History is limited as she is in distress but per EMS report and from the patient she has been sick for several days to a week and was diagnosed with the flu at [facility] ER yesterday.  EMS found her to have sats in the 60s and they put her on nonrebreather and try to put on CPAP but she would not tolerate it.  Patient otherwise is altered and while she is talking to me and try to take off the mask is unclear how valid her responses are.  She denies illicit drug use.  Home Medications Prior to Admission medications   Medication Sig Start Date End Date Taking? Authorizing Provider  gabapentin (NEURONTIN) 600 MG tablet Take 600 mg by mouth 3 (three) times daily.    [provider]  HYDROcodone-acetaminophen (NORCO/VICODIN) 5-325 MG tablet Take 1 tablet by mouth every 4 (four) hours as needed. 03/06/19   Rancour, Jeannett Senior, MD  hydrOXYzine (VISTARIL) 25 MG capsule Take 25 mg by mouth every 6 (six) hours as needed for anxiety.    [provider]  QUEtiapine (SEROQUEL) 200 MG tablet Take 200 mg by mouth at bedtime.    [provider]      Allergies    Patient has no known allergies.    Review of Systems   Review of Systems  Unable to perform ROS: Severe respiratory distress    Physical Exam Updated Vital Signs BP (!) 76/51   Pulse (!) 132   Temp 98.4 F (36.9 C)   Resp (!) 24   Ht 5\' 6"  (1.676 m)   Wt 77.1 kg   SpO2 (!) 65%   BMI 27.43 kg/m  Physical Exam Vitals and nursing note reviewed.  Constitutional:      General: She is in acute distress.     Appearance: She is well-developed. She is ill-appearing.  HENT:     Head: Normocephalic and atraumatic.   Cardiovascular:     Rate and Rhythm: Regular rhythm. Tachycardia present.     Heart sounds: Normal heart sounds.  Pulmonary:     Effort: Tachypnea, accessory muscle usage and respiratory distress present.     Breath sounds: Rhonchi and rales present.  Skin:    General: Skin is dry.     Comments: Patient has mottling to the lower extremities  Neurological:     Mental Status: She is alert.     ED Results / Procedures / Treatments   Labs (all labs ordered are listed, but only abnormal results are displayed) Labs Reviewed  LACTIC ACID, PLASMA - Abnormal; Notable for the following components:      Result Value   Lactic Acid, Venous 8.6 (*)    All other components within normal limits  CBC WITH DIFFERENTIAL/PLATELET - Abnormal; Notable for the following components:   WBC 1.3 (*)    RBC 5.84 (*)    Hemoglobin 15.6 (*)    HCT 50.0 (*)    RDW 15.9 (*)    Neutro Abs 0.4 (*)    Lymphs Abs 0.4 (*)    Abs Immature Granulocytes 0.30 (*)    All other components within normal limits  PROTIME-INR - Abnormal; Notable for  the following components:   Prothrombin Time 22.8 (*)    INR 2.0 (*)    All other components within normal limits  APTT - Abnormal; Notable for the following components:   aPTT 44 (*)    All other components within normal limits  I-STAT CHEM 8, ED - Abnormal; Notable for the following components:   Sodium 133 (*)    BUN 33 (*)    Creatinine, Ser 1.90 (*)    Glucose, Bld 125 (*)    Calcium, Ion 0.92 (*)    TCO2 18 (*)    Hemoglobin 17.3 (*)    HCT 51.0 (*)    All other components within normal limits  RESP PANEL BY RT-PCR (RSV, FLU A&B, COVID)  RVPGX2  CULTURE, BLOOD (ROUTINE X 2)  CULTURE, BLOOD (ROUTINE X 2)  LACTIC ACID, PLASMA  URINALYSIS, W/ REFLEX TO CULTURE (INFECTION SUSPECTED)  BLOOD GAS, ARTERIAL  COMPREHENSIVE METABOLIC PANEL    EKG EKG Interpretation Date/Time:  Wednesday October 27 2023 13:18:30 EST Ventricular Rate:  138 PR Interval:  129 QRS  Duration:  77 QT Interval:  255 QTC Calculation: 387 R Axis:   76  Text Interpretation: Sinus tachycardia LAE, consider biatrial enlargement Borderline repolarization abnormality Confirmed by Pricilla Loveless [phone]) on 11/01/2023 3:41:15 PM  Radiology No results found.  Procedures Procedure Name: Intubation Date/Time: 11/10/2023 3:50 PM  Performed by: Pricilla Loveless, MDPre-anesthesia Checklist: Patient identified, Patient being monitored, Emergency Drugs available, Timeout performed and Suction available Oxygen Delivery Method: Non-rebreather mask Preoxygenation: Pre-oxygenation with 100% oxygen Induction Type: Rapid sequence Ventilation: Mask ventilation without difficulty Laryngoscope Size: Glidescope and 3 Grade View: Grade I Tube size: 7.5 mm Number of attempts: 1 Airway Equipment and Method: Video-laryngoscopy Placement Confirmation: ETT inserted through vocal cords under direct vision, CO2 detector and Breath sounds checked- equal and bilateral Dental Injury: Teeth and Oropharynx as per pre-operative assessment     Central Line  Date/Time: 11/18/2023 3:50 PM  Performed by: Pricilla Loveless, MD Authorized by: Pricilla Loveless, MD   Consent:    Consent obtained:  Emergent situation Universal protocol:    Patient identity confirmed:  Anonymous protocol, patient vented/unresponsive Pre-procedure details:    Indication(s): central venous access and insufficient peripheral access     Skin preparation:  Chlorhexidine Anesthesia:    Anesthesia method:  None Procedure details:    Location:  R femoral   Procedural supplies:  Triple lumen   Landmarks identified: yes     Ultrasound guidance: yes     Ultrasound guidance timing: real time     Sterile ultrasound techniques: Sterile gel and sterile probe covers were used     Number of attempts:  1   Successful placement: yes   Post-procedure details:    Post-procedure:  Dressing applied and line sutured   Assessment:  Blood  return through all ports and free fluid flow   Procedure completion:  Tolerated well, no immediate complications CHEST TUBE INSERTION  Date/Time: 10/23/2023 3:51 PM  Performed by: Pricilla Loveless, MD Authorized by: Pricilla Loveless, MD   Consent:    Consent obtained:  Emergent situation Universal protocol:    Patient identity confirmed:  Anonymous protocol, patient vented/unresponsive Pre-procedure details:    Skin preparation:  Chlorhexidine   Preparation: Patient was prepped and draped in the usual sterile fashion   Procedure details:    Placement location:  L lateral   Tube size (Fr):  Minicatheter   Suture material:  0 silk   Dressing:  4x4 sterile  gauze Post-procedure details:    Post-insertion x-ray findings: tube in good position     Procedure completion:  Tolerated well, no immediate complications .Critical Care  Performed by: Pricilla Loveless, MD Authorized by: Pricilla Loveless, MD   Critical care provider statement:    Critical care time (minutes):  100   Critical care time was exclusive of:  Separately billable procedures and treating other patients   Critical care was necessary to treat or prevent imminent or life-threatening deterioration of the following conditions:  Respiratory failure, renal failure and shock   Critical care was time spent personally by me on the following activities:  Development of treatment plan with patient or surrogate, discussions with consultants, evaluation of patient's response to treatment, examination of patient, ordering and review of laboratory studies, ordering and review of radiographic studies, ordering and performing treatments and interventions, pulse oximetry, re-evaluation of patient's condition and review of old charts   Care discussed with: admitting provider       Medications Ordered in ED Medications  azithromycin (ZITHROMAX) 500 mg in sodium chloride 0.9 % 250 mL IVPB (500 mg Intravenous New Bag/Given 11/05/2023 1435)  vancomycin  (VANCOREADY) IVPB 1500 mg/300 mL (1,500 mg Intravenous New Bag/Given 10/23/2023 1437)  fentaNYL 10 mcg/ml infusion (has no administration in time range)  fentaNYL in NS (28mcg/ml) infusion-PREMIX (has no administration in time range)  fentaNYL (SUBLIMAZE) bolus via infusion 50-100 mcg (has no administration in time range)  midazolam (VERSED) injection 1-2 mg (2 mg Intravenous Given 11/01/2023 1429)  norepinephrine (LEVOPHED) 4mg  in (0.016 mg/mL) premix infusion (26 mcg/min Intravenous Rate/Dose Change 10/30/2023 1454)  norepinephrine (LEVOPHED) 4mg  in (0.016 mg/mL) premix infusion (has no administration in time range)  midazolam (VERSED) 100 mg/100 mL (1 mg/mL) premix infusion (0.5 mg/hr Intravenous New Bag/Given 11/06/2023 1448)  vasopressin (PITRESSIN) 20 Units in 100 mL (0.2 unit/mL) infusion-*FOR SHOCK* (0.03 Units/min Intravenous New Bag/Given 10/30/2023 1449)  hydrocortisone sodium succinate (SOLU-CORTEF) 100 MG injection (has no administration in time range)  lactated ringers bolus 1,000 mL (has no administration in time range)  cefTRIAXone (ROCEPHIN) 2 g in sodium chloride 0.9 % 100 mL IVPB (2 g Intravenous New Bag/Given 11/07/2023 1436)  etomidate (AMIDATE) injection ( Intravenous Canceled Entry 11/16/2023 1345)  rocuronium (ZEMURON) injection ( Intravenous Canceled Entry 11/19/2023 1345)  EPINEPHrine (ADRENALIN) 1 MG/10ML injection (1 mg Intravenous Given 11/17/2023 1346)  fentaNYL (SUBLIMAZE) injection 50 mcg (50 mcg Intravenous Given 10/31/2023 1458)  hydrocortisone sodium succinate (SOLU-CORTEF) 100 MG injection 100 mg (100 mg Intravenous Given 10/29/2023 1452)    ED Course/ Medical Decision Making/ A&P                                 Medical Decision Making Amount and/or Complexity of Data Reviewed External Data Reviewed: notes.    Details: Flu A in the ER yesterday Labs: ordered.    Details: Lactate of 8.  Acute kidney injury and leukopenia with neutropenia Radiology: ordered and  independent interpretation performed.    Details: Pneumonia, likely left-sided pneumothorax ECG/medicine tests: ordered.  Risk Prescription drug management. Decision regarding hospitalization.   Patient presents with respiratory distress.  Did not tolerate BiPAP well.  Ultimately was intubated and briefly coded right after intubation for 2 rounds of CPR and 1 epinephrine.  Was given fluids, pressors, and started on antibiotics.  Discussed with ICU, Dr. Denese Killings, and when we were looking at the chest x-ray together  it seems like she has a likely left-sided pneumothorax and so a chest tube was placed.  This was more difficult than expected but ultimately chest tube was able to be placed.  2 total attempts were obtained.  Ultimately she is in shock and was started on vasopressin, Levophed, IV fluids, and given hydrocortisone IV.  She does have a history of drug abuse and as well as recent flu and so with such significant pneumonia I have added vancomycin in addition to typical CAP coverage of Rocephin and azithromycin.  Was found to be quite acidotic and so I asked respiratory to increase her rate from 24-30 and we are also giving her a bicarb bolus and drip.  She was also given calcium for quite low calcium which could contribute to shock.  Admitted to the [facility], ICU in critical condition.        Final Clinical Impression(s) / ED Diagnoses Final diagnoses:  Acute respiratory failure with hypoxia (HCC)  Pneumonia of both lungs due to infectious organism, unspecified part of lung  Septic shock (HCC)  Acute kidney injury (HCC)  Pneumothorax, unspecified type    Rx / DC Orders ED Discharge Orders     None         Pricilla Loveless, MD 10/24/2023 1554

## 2023-11-23 NOTE — Sepsis Progress Note (Addendum)
 1635 Notified provider of need to order fluid bolus.  1820 Notified bedside nurse of need to draw repeat lactic acid.

## 2023-11-23 NOTE — Progress Notes (Signed)
 Patient is a Medical Examiner's case per Ezzard Flax, RN.

## 2023-11-23 NOTE — Procedures (Signed)
 Arterial Catheter Insertion Procedure Note  Melodye Swor  [id number]  November 19, 1980  Date:11/11/2023  Time:7:10 PM    Provider Performing: Lynnell Catalan    Procedure: Insertion of Arterial Line (13244) with US guidance (01027)   Indication(s) Blood pressure monitoring and/or need for frequent ABGs  Consent Unable to obtain consent due to emergent nature of procedure.  Anesthesia None   Time Out Verified patient identification, verified procedure, site/side was marked, verified correct patient position, special equipment/implants available, medications/allergies/relevant history reviewed, required imaging and test results available.   Sterile Technique Maximal sterile technique including full sterile barrier drape, hand hygiene, sterile gown, sterile gloves, mask, hair covering, sterile ultrasound probe cover (if used).   Procedure Description Area of catheter insertion was cleaned with chlorhexidine and draped in sterile fashion. With real-time ultrasound guidance an arterial catheter was placed into the left femoral artery.  Appropriate arterial tracings confirmed on monitor.     Complications/Tolerance None; patient tolerated the procedure well.   EBL Minimal   Specimen(s) None  Lynnell Catalan, MD [facility] ICU Physician [facility] [place] [facility]  Pager: [phone] Or Epic Secure Chat After hours: [phone].  11/16/2023, 7:14 PM

## 2023-11-23 NOTE — Discharge Summary (Signed)
 DEATH SUMMARY   Patient Details  Name: Lynn Marshall MRN: [id number] DOB: 1980-12-20  Admission/Discharge Information   Admit Date:  Nov 10, 2023  Date of Death: Date of Death: 2023/11/10  Time of Death: Time of Death: 11-14-13  Length of Stay: 1  Referring Physician: [facility]   Reason(s) for Hospitalization  Septic shock  Diagnoses  Preliminary cause of death: pneumonia Secondary Diagnoses (including complications and co-morbidities):  Principal Problem:   Acute respiratory failure (HCC) Septic shock Pneumonia AKI DIC  Brief Hospital Course (including significant findings, care, treatment, and services provided and events leading to death)  Lynn Marshall is a 43 y.o. year old female who presented to [facility] from home via EMS. She arrived in extremis and was intubated after which she suffered cardiac arrest. She was promptly resuscitated but remained in shock and was transported to [facility]. CXR showed dense right lung consolidation.  On arrival she was already on high dose pressors, mottled and unresponsive. She arrested again and was resuscitated. She remained hypotensive and vasopressors were escalated to maximal doses. O2 saturation remained low and we were preparing a last ditch attempt to prone the patient to improve oxygenation when she again became bradycardic and hypotensive. At that point given that she was already on maximal therapy with no further interventions possible and that she had been hypoxic and had previously arrested twice placing her at high risk for severe anoxic brain injury, it was decided that a further attempt at resuscitation would be futile and the patient was allowed to expire peacefully. Family was finally reached after 2 tries and informed of the patient's passing.   Pertinent Labs and Studies  Significant Diagnostic Studies DG Chest Portable 1 View Result Date: 11/10/2023 CLINICAL DATA:  Chest tube placement. EXAM: PORTABLE CHEST 1 VIEW  COMPARISON:  11-10-23 at 1415 hours. FINDINGS: Endotracheal tube terminates 3.4 cm above the carina. Nasogastric tube terminates in the stomach. Interval placement of a percutaneous chest tube in the apex of the left hemithorax. Residual small to moderate left pneumothorax, seen predominantly in the base of the left hemithorax. Mixed interstitial and airspace opacification bilaterally with consolidation in the right lower lobe. Small amount of subcutaneous emphysema along the lateral left chest wall. IMPRESSION: 1. Interval placement of a small bore chest tube in the apex of the left hemithorax. Small to moderate residual, and predominantly basilar, left pneumothorax. 2. Severe bilateral airspace opacification, right greater than left. Findings may be due to aspiration, pulmonary hemorrhage or pneumonia. Electronically Signed   By: Leanna Battles M.D.   On: 11/10/23 18:03   DG Chest Port 1 View Result Date: November 10, 2023 CLINICAL DATA:  Status post intubation. EXAM: PORTABLE CHEST 1 VIEW COMPARISON:  September 27, 2014 FINDINGS: An endotracheal tube is seen with its distal tip approximately 4.1 cm from the carina. And enteric tube is also seen with its distal end extending into the body of the stomach. The heart size and mediastinal contours are within normal limits. Marked severity bilateral infiltrates are seen, right greater than left. A small right pleural effusion is noted. A small to moderate sized pneumothorax is seen involving predominantly the left lung base and lateral aspect of the lower left lung. The visualized skeletal structures are unremarkable. IMPRESSION: 1. Small to moderate size left-sided pneumothorax, as described above. 2. Endotracheal tube and enteric tube positioning, as described above. 3. Marked severity bilateral infiltrates, right greater than left. 4. Small right pleural effusion. Electronically Signed   By: Demetrius Revel.D.  On: 11/02/2023 16:49    Microbiology Recent  Results (from the past 240 hours)  Blood Culture (routine x 2)     Status: None (Preliminary result)   Collection Time: 10/28/2023  1:41 PM   Specimen: BLOOD  Result Value Ref Range Status   Specimen Description BLOOD RIGHT ANTECUBITAL  Final   Special Requests   Final    BOTTLES DRAWN AEROBIC AND ANAEROBIC Blood Culture results may not be optimal due to an inadequate volume of blood received in culture bottles   Culture   Final    NO GROWTH < 24 HOURS Performed at [facility], [address] [place], [place] [address]    Report Status PENDING  Incomplete  Blood Culture (routine x 2)     Status: None (Preliminary result)   Collection Time: 10/30/2023  2:10 PM   Specimen: BLOOD  Result Value Ref Range Status   Specimen Description BLOOD FEMORAL ARTERY  Final   Special Requests   Final    BOTTLES DRAWN AEROBIC AND ANAEROBIC Blood Culture adequate volume   Culture   Final    NO GROWTH < 24 HOURS Performed at [facility], [address], [place], [place] [address]    Report Status PENDING  Incomplete  Resp panel by RT-PCR (RSV, Flu A&B, Covid) Urine, Catheterized     Status: Abnormal   Collection Time: 11/15/2023  4:24 PM   Specimen: Urine, Catheterized; Nasal Swab  Result Value Ref Range Status   SARS Coronavirus 2 by RT PCR NEGATIVE NEGATIVE Final    Comment: (NOTE) SARS-CoV-2 target nucleic acids are NOT DETECTED.  The SARS-CoV-2 RNA is generally detectable in upper respiratory specimens during the acute phase of infection. The lowest concentration of SARS-CoV-2 viral copies this assay can detect is 138 copies/mL. A negative result does not preclude SARS-Cov-2 infection and should not be used as the sole basis for treatment or other patient management decisions. A negative result may occur with  improper specimen collection/handling, submission of specimen other than nasopharyngeal swab, presence of viral mutation(s) within the areas targeted by this assay, and inadequate number of  viral copies(<138 copies/mL). A negative result must be combined with clinical observations, patient history, and epidemiological information. The expected result is Negative.  Fact Sheet for Patients:  BloggerCourse.com  Fact Sheet for Healthcare Providers:  SeriousBroker.it  This test is no t yet approved or cleared by the [place] FDA and  has been authorized for detection and/or diagnosis of SARS-CoV-2 by FDA under an Emergency Use Authorization (EUA). This EUA will remain  in effect (meaning this test can be used) for the duration of the COVID-19 declaration under Section 564(b)(1) of the Act, 21 U.S.C.section 360bbb-3(b)(1), unless the authorization is terminated  or revoked sooner.       Influenza A by PCR POSITIVE (A) NEGATIVE Final   Influenza B by PCR NEGATIVE NEGATIVE Final    Comment: (NOTE) The Xpert Xpress SARS-CoV-2/FLU/RSV plus assay is intended as an aid in the diagnosis of influenza from Nasopharyngeal swab specimens and should not be used as a sole basis for treatment. Nasal washings and aspirates are unacceptable for Xpert Xpress SARS-CoV-2/FLU/RSV testing.  Fact Sheet for Patients: BloggerCourse.com  Fact Sheet for Healthcare Providers: SeriousBroker.it  This test is not yet approved or cleared by the [place] FDA and has been authorized for detection and/or diagnosis of SARS-CoV-2 by FDA under an Emergency Use Authorization (EUA). This EUA will remain in effect (meaning this test can be used)  for the duration of the COVID-19 declaration under Section 564(b)(1) of the Act, 21 U.S.C. section 360bbb-3(b)(1), unless the authorization is terminated or revoked.     Resp Syncytial Virus by PCR NEGATIVE NEGATIVE Final    Comment: (NOTE) Fact Sheet for Patients: BloggerCourse.com  Fact Sheet for Healthcare  Providers: SeriousBroker.it  This test is not yet approved or cleared by the [place] FDA and has been authorized for detection and/or diagnosis of SARS-CoV-2 by FDA under an Emergency Use Authorization (EUA). This EUA will remain in effect (meaning this test can be used) for the duration of the COVID-19 declaration under Section 564(b)(1) of the Act, 21 U.S.C. section 360bbb-3(b)(1), unless the authorization is terminated or revoked.  Performed at [facility], [address], [place], [place] [address]     Lab Basic Metabolic Panel: Recent Labs  Lab 11/18/2023 1342 11/15/2023 1435 10/23/2023 1815 10/23/2023 1850  NA 133* 133* 142 142  K 3.7 2.8* 3.0* 2.6*  CL 100 102 99  --   CO2  --  16* 26  --   GLUCOSE 125* 123* 75  --   BUN 33* 24* 25*  --   CREATININE 1.90* 2.11* 2.43*  --   CALCIUM  --  6.4* 6.2*  --    Liver Function Tests: Recent Labs  Lab 11/11/2023 1435 11/07/2023 1815  AST 38 19  ALT 12 8  ALKPHOS 33* 27*  BILITOT 0.7 0.7  PROT 4.5* 3.4*  ALBUMIN 1.9* <1.5*   No results for input(s): "LIPASE", "AMYLASE" in the last 168 hours. No results for input(s): "AMMONIA" in the last 168 hours. CBC: Recent Labs  Lab 11/03/2023 1341 10/29/2023 1342 11/15/2023 1815 11/17/2023 1816 10/23/2023 1850  WBC 1.3*  --  2.2*  --   --   NEUTROABS 0.4*  --  0.9*  --   --   HGB 15.6* 17.3* 10.1*  --  10.2*  HCT 50.0* 51.0* 31.7*  --  30.0*  MCV 85.6  --  85.2  --   --   PLT 212  --  73* 74*  --    Cardiac Enzymes: No results for input(s): "CKTOTAL", "CKMB", "CKMBINDEX", "TROPONINI" in the last 168 hours. Sepsis Labs: Recent Labs  Lab 11/21/2023 1341 11/10/2023 1411 10/29/2023 1815  PROCALCITON  --   --  111.07  WBC 1.3*  --  2.2*  LATICACIDVEN  --  8.6* 8.2*    Procedures/Operations  Intubation, central line placement, arterial line placement, chest tube placement. CPR   Kinsey Cowsert 10/28/2023, 5:45 PM

## 2023-11-23 NOTE — ED Notes (Signed)
 Pt uncooperative with leaving NRB on, pt keeps pulling it off.

## 2023-11-23 NOTE — ED Triage Notes (Signed)
 Pt BIB [facility] for emergency traffic for respiratory distress, dx'd with flu at [facility] yesterday. Pt RA sats initially 78%, NRB applied and increased to 95%. Pt would not tolerate CPAP en route.

## 2023-11-23 NOTE — ED Notes (Signed)
RT at bedside, placing pt on BiPap

## 2023-11-23 NOTE — H&P (Signed)
 NAME:  Lynn Marshall, MRN:  [id number], DOB:  Nov 26, 1980, LOS: 0 ADMISSION DATE:  11/14/2023, CONSULTATION DATE:  10/30/2023 REFERRING MD:  Criss Alvine, CHIEF COMPLAINT:  cardiac arrest    History of Present Illness:  43 yo F PMH substance use disorder who was dc with Flu 3/4, presented to [facility] ED 3/5 w SOB/resp distress. She was intubated in ED, and sustained brief arrest (5-64min) post intubation. In shock after ROSC and found to have a ptx -- required CVC placement and multiple pressors to be initiated and a chest tube to be placed.   Transferring to [facility] in this setting   Pertinent  Medical History  Drug use   Significant Hospital Events: Including procedures, antibiotic start and stop dates in addition to other pertinent events   3/5 cardiac arrest after intubation   Interim History / Subjective:  CVC and chest tube placed at [facility] On pressors  Fever to 102   Arrested again on arrival at [facility] ROSC after of CPR and 1 amp of epinephrine and 2 amps of bicarbonate.   Objective   Blood pressure 98/78, pulse (!) 135, temperature (!) 102 F (38.9 C), resp. rate (!) 30, height 5\' 6"  (1.676 m), weight 77.1 kg, SpO2 94%.    Vent Mode: PRVC FiO2 (%):  [80 %-100 %] 100 % Set Rate:  [24 bmp] 24 bmp Vt Set:  [470 mL] 470 mL PEEP:  [5 cmH20] 5 cmH20 Plateau Pressure:  [20 cmH20] 20 cmH20   Intake/Output Summary (Last 24 hours) at 11/17/2023 1721 Last data filed at 11/07/2023 1612 Gross per 24 hour  Intake 350 ml  Output --  Net 350 ml   Filed Weights   11/09/2023 1310  Weight: 77.1 kg    Examination: General: intubated. Mottled  HENT: orally intubated.  Lungs: crackles throughout. Chest tube on left side. Cardiovascular: HS distant. Extremities are cold. Abdomen: taught Extremities: no edema. Neuro: unresponsive to painful stimulation.  GU: Foley in place with no urine.   Ancillary tests:  ABG 6.98/77/64 Creat 2.11 K:2.8 INR 2.0 Influenza A +  Assessment & Plan:   Acute  resp failure with hypoxia and hypercarbia  Flu A PNA L Ptx s/p chest tube Cardiac arrest  Shock  AKI  DIC Hyponatremia Hypokalemia Hypocalcemia   Plan:  - Patient arrived in profound shock with pressors dropping into the 50's despite NE 60, Vaso 0.04 and Epi 10. - Sats in the 70's  - POC shows global biventricular failure. - The patient is in extremis and is not responding to treatment for septic shock with multi-system organ failure (AKI, DIC) - She suffered a further 3rd PEA arrest and given that she was already on maximal therapy with no further support options, she not resuscitated again.   - She currently in an agonal rhythm. RN to declare. - We have been unable to reach mother. Will try again this evening.  CRITICAL CARE Performed by: Lynnell Catalan   Total critical care time: 45 minutes  Critical care time was exclusive of separately billable procedures and treating other patients.  Critical care was necessary to treat or prevent imminent or life-threatening deterioration.  Critical care was time spent personally by me on the following activities: development of treatment plan with patient and/or surrogate as well as nursing, discussions with consultants, evaluation of patient's response to treatment, examination of patient, obtaining history from patient or surrogate, ordering and performing treatments and interventions, ordering and review of laboratory studies, ordering and review of  radiographic studies, pulse oximetry, re-evaluation of patient's condition and participation in multidisciplinary rounds.  Lynnell Catalan, MD [facility] ICU Physician [facility]  Pager: [phone] Mobile: [phone] After hours: [phone].

## 2023-11-23 NOTE — ED Notes (Signed)
 [facility] Transporting to [employer].

## 2023-11-23 DEATH — deceased
